# Patient Record
Sex: Male | Born: 1960 | Race: Black or African American | Hispanic: No | Marital: Married | Smoking: Former smoker
Health system: Southern US, Community
[De-identification: ages and names within clinical notes are randomized; demographics above are authoritative.]

## PROBLEM LIST (undated history)

## (undated) DIAGNOSIS — T7840XA Allergy, unspecified, initial encounter: Secondary | ICD-10-CM

## (undated) HISTORY — DX: Allergy, unspecified, initial encounter: T78.40XA

---

## 2008-06-04 ENCOUNTER — Ambulatory Visit: Payer: Self-pay | Admitting: Family Medicine

## 2008-06-04 DIAGNOSIS — F5232 Male orgasmic disorder: Secondary | ICD-10-CM | POA: Insufficient documentation

## 2008-06-04 DIAGNOSIS — R109 Unspecified abdominal pain: Secondary | ICD-10-CM | POA: Insufficient documentation

## 2008-06-04 DIAGNOSIS — K921 Melena: Secondary | ICD-10-CM | POA: Insufficient documentation

## 2008-06-04 DIAGNOSIS — H6120 Impacted cerumen, unspecified ear: Secondary | ICD-10-CM | POA: Insufficient documentation

## 2008-09-02 ENCOUNTER — Ambulatory Visit: Payer: Self-pay | Admitting: Family Medicine

## 2008-09-02 LAB — CONVERTED CEMR LAB: Rapid Strep: NEGATIVE

## 2008-09-03 ENCOUNTER — Encounter: Payer: Self-pay | Admitting: Family Medicine

## 2008-09-03 ENCOUNTER — Telehealth: Payer: Self-pay | Admitting: Family Medicine

## 2009-06-08 ENCOUNTER — Telehealth: Payer: Self-pay | Admitting: Family Medicine

## 2009-07-09 ENCOUNTER — Ambulatory Visit: Payer: Self-pay | Admitting: Family Medicine

## 2009-07-09 DIAGNOSIS — R5383 Other fatigue: Secondary | ICD-10-CM

## 2009-07-09 DIAGNOSIS — R5381 Other malaise: Secondary | ICD-10-CM | POA: Insufficient documentation

## 2009-07-10 ENCOUNTER — Telehealth: Payer: Self-pay | Admitting: Family Medicine

## 2009-07-11 ENCOUNTER — Emergency Department: Payer: Self-pay | Admitting: Emergency Medicine

## 2009-07-13 ENCOUNTER — Telehealth: Payer: Self-pay | Admitting: Family Medicine

## 2009-07-13 LAB — CONVERTED CEMR LAB
ALT: 34 units/L (ref 0–53)
AST: 37 units/L (ref 0–37)
Albumin: 4.1 g/dL (ref 3.5–5.2)
Alkaline Phosphatase: 37 units/L — ABNORMAL LOW (ref 39–117)
BUN: 24 mg/dL — ABNORMAL HIGH (ref 6–23)
Basophils Absolute: 0 10*3/uL (ref 0.0–0.1)
Basophils Relative: 0.7 % (ref 0.0–3.0)
Bilirubin, Direct: 0.1 mg/dL (ref 0.0–0.3)
CO2: 30 meq/L (ref 19–32)
Calcium: 9.7 mg/dL (ref 8.4–10.5)
Chloride: 107 meq/L (ref 96–112)
Creatinine, Ser: 1 mg/dL (ref 0.4–1.5)
Direct LDL: 114.1 mg/dL
Eosinophils Absolute: 0.1 10*3/uL (ref 0.0–0.7)
Eosinophils Relative: 2.6 % (ref 0.0–5.0)
GFR calc non Af Amer: 103.15 mL/min (ref >60)
Glucose, Bld: 73 mg/dL (ref 70–99)
HCT: 39.6 % (ref 39.0–52.0)
Hemoglobin: 12.9 g/dL — ABNORMAL LOW (ref 13.0–17.0)
Lymphocytes Relative: 31.9 % (ref 12.0–46.0)
Lymphs Abs: 1.4 10*3/uL (ref 0.7–4.0)
MCHC: 32.6 g/dL (ref 30.0–36.0)
MCV: 101.3 fL — ABNORMAL HIGH (ref 78.0–100.0)
Monocytes Absolute: 0.3 10*3/uL (ref 0.1–1.0)
Monocytes Relative: 5.9 % (ref 3.0–12.0)
Neutro Abs: 2.7 10*3/uL (ref 1.4–7.7)
Neutrophils Relative %: 58.9 % (ref 43.0–77.0)
PSA: 0.31 ng/mL (ref 0.10–4.00)
Platelets: 224 10*3/uL (ref 150.0–400.0)
Potassium: 4.3 meq/L (ref 3.5–5.1)
RBC: 3.91 M/uL — ABNORMAL LOW (ref 4.22–5.81)
RDW: 11.9 % (ref 11.5–14.6)
Sodium: 139 meq/L (ref 135–145)
TSH: 0.99 microintl units/mL (ref 0.35–5.50)
Total Bilirubin: 1 mg/dL (ref 0.3–1.2)
Total Protein: 7.7 g/dL (ref 6.0–8.3)
WBC: 4.5 10*3/uL (ref 4.5–10.5)

## 2009-07-17 ENCOUNTER — Encounter (INDEPENDENT_AMBULATORY_CARE_PROVIDER_SITE_OTHER): Payer: Self-pay | Admitting: *Deleted

## 2009-07-17 ENCOUNTER — Telehealth: Payer: Self-pay | Admitting: Family Medicine

## 2009-07-24 ENCOUNTER — Ambulatory Visit: Payer: Self-pay | Admitting: Family Medicine

## 2009-07-24 DIAGNOSIS — J069 Acute upper respiratory infection, unspecified: Secondary | ICD-10-CM | POA: Insufficient documentation

## 2010-01-13 ENCOUNTER — Ambulatory Visit: Payer: Self-pay | Admitting: Family Medicine

## 2010-06-15 NOTE — Assessment & Plan Note (Signed)
Summary: PULLED GROIN / LFW   Vital Signs:  Patient profile:   50 year old male Height:      73 inches Weight:      208 pounds BMI:     27.54 Temp:     98 degrees F oral Pulse rate:   60 / minute Pulse rhythm:   regular BP sitting:   124 / 82  (left arm) Cuff size:   regular  Vitals Entered By: Lewanda Rife LPN (January 13, 2010 3:39 PM) CC: pulled right groin   History of Present Illness: pulled a groin muscle -- started to hurt the day after softball practice  (no falls or specific events)  this is the 3rd week  is letting up but still really aggrivating  pinch with ext rot of the hip   no swelling in the area  no pain in the testicle at all and no bulge no back pain   no hernia in past   is taking some advil- cannot tell if it is helping or not    has been soaking in warm tub with epsom salts - this helps a bit      Allergies (verified): No Known Drug Allergies  Past History:  Past Medical History: Last updated: 06/04/2008 Alcoholic, recovering, stopped 4098 h/o occ other drugs Allergic Rhinitis  NO NARCOTICS OR CONTROLLED SUBSTANCES  Past Surgical History: Last updated: 06/04/2008 none  Family History: Last updated: 06/04/2008 Family History Hypertension  Social History: Last updated: 06/04/2008 Married + Retail buyer Recovering Alcoholic h/o Tob, quit. Regular exercise-no  Risk Factors: Alcohol Use: 0 (07/09/2009) Diet: doing great, recent changes, low fat, low carb, much veggies, lean meats (07/09/2009) Exercise: yes (07/09/2009)  Risk Factors: Smoking Status: quit > 6 months (07/09/2009)  Review of Systems General:  Denies chills, fatigue, fever, loss of appetite, and malaise. CV:  Denies chest pain or discomfort and palpitations. Resp:  Denies cough and shortness of breath. GI:  Denies abdominal pain, change in bowel habits, and nausea. GU:  Denies discharge, dysuria, hematuria, and urinary frequency. MS:   Complains of stiffness; denies joint redness and joint swelling. Derm:  Denies lesion(s), poor wound healing, and rash. Neuro:  Denies numbness, tingling, and weakness. Endo:  Denies excessive urination. Heme:  Denies abnormal bruising and bleeding.  Physical Exam  General:  Well-developed,well-nourished,in no acute distress; alert,appropriate and cooperative throughout examination Head:  normocephalic, atraumatic, and no abnormalities observed.   Mouth:  pharynx pink and moist.   Neck:  nl rom / no bony tenderness Chest Wall:  No deformities, masses, tenderness or gynecomastia noted. Lungs:  Normal respiratory effort, chest expands symmetrically. Lungs are clear to auscultation, no crackles or wheezes. Heart:  Normal rate and regular rhythm. S1 and S2 normal without gallop, murmur, click, rub or other extra sounds. Abdomen:  no suprapubic tenderness or fullness felt  normal bowel sounds.   Msk:  no LS tenderness  slt tenderness of R hamstring (very mild)  pain upon ext rot of hip active and passive  nl int rotation  nl slr   Extremities:  No clubbing, cyanosis, edema, or deformity noted with normal full range of motion of all joints.   Neurologic:  sensation intact to light touch, gait normal, and DTRs symmetrical and normal.   Skin:  Intact without suspicious lesions or rashes Inguinal Nodes:  No significant adenopathy Psych:  normal affect, talkative and pleasant    Impression & Recommendations:  Problem # 1:  INGUINAL PAIN,  RIGHT (ICD-789.09) Assessment New with fairly nl exam except pain on external rotation  no hernia/ mass or skin change noted suspect groin muscle strain / spasm that is gradually improving we disc continuing low impact exercise like walking/ gentle stretches/ and applic of heat for 10 min at a time if worse or no imp in 1-2 weeks f/u with Dr Patsy Lager  Complete Medication List: 1)  Viagra 50 Mg Tabs (Sildenafil citrate) .... Take 30-60 minutes prior to  sex as needed 2)  Fexofenadine-pseudoephedrine 60-120 Mg Xr12h-tab (Fexofenadine-pseudoephedrine) .Marland Kitchen.. 1 by mouth two times a day as needed  Patient Instructions: 1)  I think you do have a groin muscle pull 2)  I recommend warm compresses or baths and stretches  3)  advil ok if it helps  4)  if you get worse or do not further improve in the next 2 weeks - update [place]   Current Allergies (reviewed today): No known allergies

## 2010-06-15 NOTE — Assessment & Plan Note (Signed)
Summary: CPX/CLE   Vital Signs:  Patient profile:   50 year old male Height:      73 inches Weight:      208.0 pounds BMI:     27.54 Temp:     98.1 degrees F oral Pulse rate:   80 / minute Pulse rhythm:   regular BP sitting:   118 / 70  (left arm) Cuff size:   regular  Vitals Entered By: Benny Lennert CMA Duncan Dull) (July 09, 2009 2:24 PM)  History of Present Illness: Chief complaint cpx  labs psa dre  colon? No FH colon CA  Has been working out for a month. Going to place and a trainer a couple of times a week. Working out with a Psychologist, educational. Not doing much cardio. Doing a good time with diet.   Preventive Screening-Counseling & Management  Alcohol-Tobacco     Alcohol drinks/day: 0     Smoking Status: quit > 6 months     Tobacco Counseling: to remain off tobacco products  Caffeine-Diet-Exercise     Diet Comments: doing great, recent changes, low fat, low carb, much veggies, lean meats     Diet Counseling: not indicated; diet is assessed to be healthy     Does Patient Exercise: yes     Type of exercise: weights, softball     Times/week: 3     Exercise Counseling: to improve exercise regimen  Comments: Reviewed, and patient maintains doing well in his sobriety and feels better than ever  Hep-HIV-STD-Contraception     STD Risk: no risk noted     Testicular SE Education/Counseling to perform regular STE      Sexual History:  currently monogamous.        Drug Use:  former.    Clinical Review Panels:  Immunizations   Last Tetanus Booster:  Tdap (07/09/2009)   Allergies (verified): No Known Drug Allergies  Past History:  Past medical, surgical, family and social histories (including risk factors) reviewed, and no changes noted (except as noted below).  Past Medical History: Reviewed history from 06/04/2008 and no changes required. Alcoholic, recovering, stopped 1610 h/o occ other drugs Allergic Rhinitis  NO NARCOTICS OR CONTROLLED SUBSTANCES  Past  Surgical History: Reviewed history from 06/04/2008 and no changes required. none  Family History: Reviewed history from 06/04/2008 and no changes required. Family History Hypertension  Social History: Reviewed history from 06/04/2008 and no changes required. 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 Regular exercise-no Smoking Status:  quit > 6 months Does Patient Exercise:  yes STD Risk:  no risk noted Sexual History:  currently monogamous Drug Use:  former  Review of Systems  General: Denies fever, chills, sweats, anorexia, fatigue, weakness, malaise Eyes: Denies blurring, vision loss ENT: Denies earache, nasal congestion, nosebleeds, sore throat, and hoarseness.  Cardiovascular: Denies chest pains, palpitations, syncope, dyspnea on exertion,  Respiratory: Denies cough, dyspnea at rest, excessive sputum,wheeezing GI: Denies nausea, vomiting, diarrhea, constipation, change in bowel habits, abdominal pain, melena, hematochezia GU: Denies dysuria, hematuria, discharge, urinary frequency, urinary hesitancy, nocturia, incontinence, genital sores, decreased libido Musculoskeletal: Denies back pain, joint pain Derm: Denies rash, itching Neuro: Denies  paresthesias, frequent falls, frequent headaches, and difficulty walking.  Psych: Denies depression, anxiety Endocrine: Denies cold intolerance, heat intolerance, polydipsia, polyphagia, polyuria, and unusual weight change.  Heme: Denies enlarged lymph nodes Allergy: No hayfever   Otherwise, the pertinent positives and negatives are listed above and in the HPI, otherwise a full review of  systems has been reviewed and is negative unless noted positive.    Impression & Recommendations:  Problem # 1:  HEALTH MAINTENANCE EXAM (ICD-V70.0) The patient's preventative maintenance and recommended screening tests for an annual wellness exam were reviewed in full today. Brought up to date unless  services declined.  Counselled on the importance of diet, exercise, and its role in overall health and mortality. The patient's FH and SH was reviewed, including their home life, tobacco status, and drug and alcohol status.   Complete Medication List: 1)  Viagra 50 Mg Tabs (Sildenafil citrate) .... Take 30-60 minutes prior to sex as needed  Other Orders: Tdap => 24yrs IM (21308) Admin 1st Vaccine (65784) Venipuncture (69629) TLB-Cholesterol, Direct LDL (83721-DIRLDL) TLB-BMP (Basic Metabolic Panel-BMET) (80048-METABOL) TLB-CBC Platelet - w/Differential (85025-CBCD) TLB-Hepatic/Liver Function Pnl (80076-HEPATIC) TLB-TSH (Thyroid Stimulating Hormone) (84443-TSH) TLB-PSA (Prostate Specific Antigen) (84153-PSA)  Current Allergies (reviewed today): No known allergies    Immunizations Administered:  Tetanus Vaccine:    Vaccine Type: Tdap    Site: left deltoid    Mfr: GlaxoSmithKline    Dose: 0.5 ml    Route: IM    Given by: Benny Lennert CMA (AAMA)    Exp. Date: 07/11/2011    Lot #: [id number] Physical Exam General Appearance: well developed, well nourished, no acute distress Eyes: conjunctiva and lids normal, PERRLA, EOMI Ears, Nose, Mouth, Throat: TM clear, nares clear, oral exam WNL Neck: supple, no lymphadenopathy, no thyromegaly, no JVD Respiratory: clear to auscultation and percussion, respiratory effort normal Cardiovascular: regular rate and rhythm, S1-S2, no murmur, rub or gallop, no bruits, peripheral pulses normal and symmetric, no cyanosis, clubbing, edema or varicosities Chest: no scars, masses, tenderness; no asymmetry, skin changes, nipple discharge, no gynecomastia   Gastrointestinal: soft, non-tender; no hepatosplenomegaly, masses; active bowel sounds all quadrants, no masses, tenderness, hemorrhoids  Genitourinary: no hernia, testicular mass, penile discharge, priapism or prostate enlargement Lymphatic: no cervical, axillary or inguinal  adenopathy Musculoskeletal: gait normal, muscle tone and strength WNL, no joint swelling, effusions, discoloration, crepitus  Skin: clear, good turgor, color WNL, no rashes, lesions, or ulcerations Neurologic: normal mental status, normal reflexes, normal strength, sensation, and motion Psychiatric: alert; oriented to person, place and time Other Exam:

## 2010-06-15 NOTE — Assessment & Plan Note (Signed)
Summary: NEW PT TO EST/CLE   Vital Signs:  Patient Profile:   50 Years Old Male Height:     73 inches (185.42 cm) Weight:      217.13 pounds (98.70 kg) Temp:     98.1 degrees F (36.72 degrees C) oral Pulse rate:   52 / minute Pulse rhythm:   regular BP sitting:   120 / 80  (left arm) Cuff size:   large  Vitals Entered By: Silas Sacramento (June 04, 2008 10:48 AM)                 Chief Complaint:  New pt.  History of Present Illness: 50 year old male patient presents to establish care in office:  Lives in [place].  Works in side in the [employer].   1. Side pain: R side. Got when sitting in the living room. Lifts boxes and stuff. Goes away and it comes back. Has not felt it this week. Hurts some when lying on the R side. Urination normal. no bowel difficulties. No nausea, vomiting, diarrhea. The patient denies any specific trauma. He noticed particularly that he gets this pain when he lies on his right side on the couch. Again, there is no difficulty this week.  2. Trouble L ear: R eear - trouble hearing out of R ear. the patient has had a continued problem with cerumen impaction in the past.  3. Stool is dark: dark brown to black. Certain fast foods upset his stomache. No bright red blood. Taking pepto bismol once a week or so. he occasionally does have some heartburn after eating. Particular with fatty foods and fried fast food. He generally takes Pepto-Bismol after this. He has not tried other problems or solutions.  4. Having trouble with ejaculation. With his wife. Gets hardons. Able to mastubate. Separated for months. Sex 2-3 times a week. he is able is to achieve an erection, and able to achieve penetration. He does sometimes have problems with dryness with his wife. They have not tried any lubrication. He has tried Viagra in the past as well. He is able to achieve orgasm with masturbation. He has had a testosterone level checked previously, which was normal.  Had  some bloodwork last year. Testosterone OK.  Married now for 7 years.    Current Allergies: No known allergies   Past Medical History:    Alcoholic, recovering, stopped 1324    h/o occ other drugs    Allergic Rhinitis        NO NARCOTICS OR CONTROLLED SUBSTANCES  Past Surgical History:    none   Family History:    Family History Hypertension  Social History:    Married + Engineer, manufacturing systems    Recovering Alcoholic    h/o Tob, quit.    Regular exercise-no    Risk Factors:  Exercise:  no   Review of Systems      See HPI       10 point ROS has been performed and is otherwise negative unless noted positive.  General      Denies chills, fatigue, fever, and weight loss.  ENT      Complains of decreased hearing.      Denies ear discharge, earache, nasal congestion, postnasal drainage, sinus pressure, and sore throat.      occ. Allergies, uses Allegra D as needed    CV      Denies chest pain or discomfort.  Resp  Denies cough.  GI      Complains of dark tarry stools and indigestion.      Denies abdominal pain, bloody stools, change in bowel habits, constipation, diarrhea, excessive appetite, loss of appetite, nausea, vomiting, and vomiting blood.  GU      See HPI      Denies decreased libido, discharge, dysuria, hematuria, incontinence, and nocturia.  MS      Complains of joint pain and low back pain.      c/o some occ. shoulder and back pain  Neuro      Denies numbness and tingling.  Psych      Denies anxiety and depression.  Endo      Denies excessive thirst, excessive urination, and polyuria.   Physical Exam  General:     Well-developed,well-nourished,in no acute distress; alert,appropriate and cooperative throughout examination Head:     Normocephalic and atraumatic without obvious abnormalities. No apparent alopecia or balding. Eyes:     vision grossly intact.   Ears:     Cerumen impaction B. o/w normal  externally Nose:     no external deformity.   Mouth:     Oral mucosa and oropharynx without lesions or exudates.  Teeth in good repair. Neck:     No deformities, masses, or tenderness noted. Chest Wall:     No deformities, masses, tenderness or gynecomastia noted. Lungs:     Normal respiratory effort, chest expands symmetrically. Lungs are clear to auscultation, no crackles or wheezes. Heart:     Normal rate and regular rhythm. S1 and S2 normal without gallop, murmur, click, rub or other extra sounds. Abdomen:     Bowel sounds positive,abdomen soft and non-tender without masses, organomegaly or hernias noted. Msk:     Mild tenderness at R posterior side musculature at oblique attachment to ribs. normal ROM, no joint instability, and no crepitation.   Pulses:     radial pulses 2+ Extremities:     No clubbing, cyanosis, edema, or deformity noted with normal full range of motion of all joints.   Neurologic:     alert & oriented X3, sensation intact to light touch, and gait normal.   Cervical Nodes:     No lymphadenopathy noted Psych:     Cognition and judgment appear intact. Alert and cooperative with normal attention span and concentration. No apparent delusions, illusions, hallucinations   Detailed Back/Spine Exam  Lumbosacral Exam:  Inspection-deformity:       ROM WNL Lying Straight Leg Raise:    Right:  negative    Left:  negative Sitting Straight Leg Raise:    Right:  negative    Left:  negative Reverse Straight Leg Raise:    Right:  negative    Left:  negative Contralateral Straight Leg Raise:    Right:  negative    Left:  negative Sciatic Notch:    There is no sciatic notch tenderness.    Impression & Recommendations:  Problem # 1:  MELENA (ICD-578.1) Assessment: New Sent home with stool cards Most likely thing Pepto - stop and switch to Pepcid or Zantac.  Problem # 2:  ANORGASMIA, MALE (ICD-302.74) Assessment: New Long discussion. Able to ejaculate  with masturbation, suspect psych component, good erections. Will get Astroglide, try some new things out with wife. May go to [employer] or other store and keep open dialogue with wife.  If continues to be a problem, would have talk to sex therapy  Viagra as needed   Problem # 3:  FLANK PAIN, RIGHT (ICD-789.09) Assessment: New reassurance given, no signs for 1 week. Do not think related to kidneys - not in right place.  No nsaids for now, restart exercise. Tylenol, moist heat as needed   Problem # 4:  CERUMEN IMPACTION, BILATERAL (ICD-380.4) Assessment: New Nursing flushed ears successfully.  Given maintenance program.  Complete Medication List: 1)  Allegra-d 12 Hour 60-120 Mg Xr12h-tab (Fexofenadine-pseudoephedrine) .... Take one tablet every 12 hours 2)  Viagra 50 Mg Tabs (Sildenafil citrate) .... Take 30-60 minutes prior to sex as needed   Patient Instructions: 1)  Astroglide - type of lube 2)  Work on things with your wife. 3)  Take the stool card. Go to lab to get - ask Terri. 4)  FOR YOUR EARS: 5)  Get over the counter Ear Wax drops 6)  Use for a few days, then get bulb and flush in the bath. 7)  follow-up for a full physical at your convenience - come fasting in the morning if you can so we can get labs.   Prescriptions: VIAGRA 50 MG TABS (SILDENAFIL CITRATE) Take 30-60 minutes prior to sex as needed  #20 x 11   Entered and Authorized by:   Hannah Beat MD   Signed by:   Hannah Beat MD on 06/04/2008   Method used:   Print then Give to Patient   RxID:   [id number]   Prior Medications (reviewed today): ALLEGRA-D 12 HOUR 60-120 MG XR12H-TAB (FEXOFENADINE-PSEUDOEPHEDRINE) take one tablet every 12 hours Current Allergies: No known allergies

## 2010-06-15 NOTE — Letter (Signed)
Summary: Out of Work  [facility]  [address] [place], [place] [address]   Phone: [phone]  Fax: [phone]    July 17, 2009   Employee:  Ollie Sequeira    To Whom It May Concern:   For Medical reasons, please excuse the above named employee from work for the following dates:  Start:   July 14 2009  End:   May return to work on March 7th 2011  If you need additional information, please feel free to contact our office.         Sincerely,    Kerby Nora MD

## 2010-06-15 NOTE — Progress Notes (Signed)
Summary: needs note for work  Phone Note Call from Patient Call back at [employer] [phone]   Summary of Call: Pt has had the flu this week, was not seen here but did talk with Dr. Patsy Lager, who told him to stay out of work.  He needs a note for work to cover 3/01 through 3/05 and will go back to work on monday, 07/20/09.  Please call when ready. Initial call taken by: Lowella Petties CMA,  July 17, 2009 9:06 AM  Follow-up for Phone Call        Please provide note as requested.  Follow-up by: Kerby Nora MD,  July 17, 2009 9:21 AM  Additional Follow-up for Phone Call Additional follow up Details #1::        NOTE READY AND PATIENT NOTIFIED TO PCIK UP AT HIS CONFEINCE Additional Follow-up by: Benny Lennert CMA Duncan Dull),  July 17, 2009 9:29 AM

## 2010-06-15 NOTE — Progress Notes (Signed)
Summary: viagra  Phone Note Refill Request Message from:  Fax from Pharmacy on June 08, 2009 11:34 AM  Refills Requested: Medication #1:  VIAGRA 50 MG TABS Take 30-60 minutes prior to sex as needed   Supply Requested: 1 month   Last Refilled: 05/24/2009 [facility] [address] [phone]   Method Requested: Electronic Initial call taken by: Benny Lennert CMA (AAMA),  June 08, 2009 11:35 AM    Prescriptions: VIAGRA 50 MG TABS (SILDENAFIL CITRATE) Take 30-60 minutes prior to sex as needed  #20 x 11   Entered and Authorized by:   Hannah Beat MD   Signed by:   Hannah Beat MD on 06/08/2009   Method used:   Electronically to        [facility] [address] [phone]* (retail)       [address], [place]  [address]       Ph: [phone]       Fax: [phone]   RxID:   [id number]

## 2010-06-15 NOTE — Progress Notes (Signed)
Summary: wants cough med  Phone Note Call from Patient Call back at Home Phone [phone]   Caller: Patient Call For: Hannah Beat MD Summary of Call: Patient says that after leaving yesterday he developed a bad cough. He says that it is so bad it is making his stomach hurt. He wants to know if he can have some cough med. sent to [facility] in [place] on [address]  Initial call taken by: Melody Comas,  July 10, 2009 3:41 PM  Follow-up for Phone Call        Let him know sent in.   This is pretty good for cough and can numb throat a little. Drink plenty of fluids, get rest. Expect to be sick at least 7-10 days with some coughing for up to 2 weeks after he starts to feel better.  For him, would not use use some of the strong cough meds with codeine, etc. in it. Follow-up by: Hannah Beat MD,  July 12, 2009 8:49 AM  Additional Follow-up for Phone Call Additional follow up Details #1::        patient advised.Consuello Masse CMA  Additional Follow-up by: Benny Lennert CMA (AAMA),  July 13, 2009 9:05 AM    New/Updated Medications: TESSALON 200 MG CAPS (BENZONATATE) Take one capsule by mouth three times a day as needed for cough Prescriptions: TESSALON 200 MG CAPS (BENZONATATE) Take one capsule by mouth three times a day as needed for cough  #60 x 0   Entered and Authorized by:   Hannah Beat MD   Signed by:   Hannah Beat MD on 07/12/2009   Method used:   Electronically to        [facility] [address] [phone]* (retail)       [address], [place]  [address]       Ph: [phone]       Fax: [phone]   RxID:   [id number]

## 2010-06-15 NOTE — Progress Notes (Signed)
Summary: call a nurse   Phone Note Call from Patient Call back at Home Phone [phone]   Caller: Patient Call For: Hannah Beat MD Summary of Call: Triage Record Num: [id number] Operator: Bethanie Dicker Patient Name: Glorious Peach Call Date & Time: 07/11/2009 4:23:11PM Patient Phone: PCP: Karleen Hampshire Azad Calame Patient Gender: Male PCP Fax : [phone] Patient DOB: 1961-03-06 Practice Name: [facility] Reason for Call: Lisa/ wife calling about fever that started yesterday 07/10/09. Last temp 102.5 O at 1625. Also complaining of cough that started yesterday and stiff neck and neck pain that strted this AM. Also c/o generalized body aches as well. Mild headache and pain in neck with mvmt. [facility] immediately for eval. Protocol(s) Used: Neck Pain or Injury Recommended Outcome per Protocol: See ED Immediately Reason for Outcome: Neck pain with movement (no injury) AND headache or nausea and vomiting Care Advice:  ~ Another adult should drive. Write down provider's name. List or place the following in a bag for transport with the patient: current prescription and/or OTC medications; alternative treatments, therapies and medications; and street drugs.  ~  ~ Do not eat or drink anything until evaluated by provider. Call EMS 911 immediately if any of the following occur: any loss of consciousness; new confusion, drowsiness or agitation; difficulty speaking; new weakness or paralysis, severe numbness, or difficulty moving.  ~  ~ IMMEDIATE ACTION 02/ Initial call taken by: Melody Comas,  July 13, 2009 10:56 AM  Follow-up for Phone Call        Please call and check on Nehemiah and make sure he is feeling OK, fever 102 - 103 much different than when i saw him Follow-up by: Hannah Beat MD,  July 13, 2009 1:14 PM  Additional Follow-up for Phone Call Additional follow up Details #1::        Left message on machine for patient to return call.  Linde Gillis  CMA Duncan Dull)  July 13, 2009 1:21 PM   Patient says he had a fever last night of 102.5, still has body aches, and a headache.  Was told to take Motrin when he left the ER.  Please advise. Additional Follow-up by: Linde Gillis CMA Duncan Dull),  July 13, 2009 3:04 PM    Additional Follow-up for Phone Call Additional follow up Details #2::    Pt is requesting that an antibiotic be called to [facility] in [place],  please let pt know.         Lowella Petties CMA  July 13, 2009 3:23 PM   Additional Follow-up for Phone Call Additional follow up Details #3:: Details for Additional Follow-up Action Taken: d/w patient and diagnosed with influenza at the ER on Sat. Feels horrible, taking some cough medicine and tylenol - discussed care in flu, no work until AF x 24 hours. Additional Follow-up by: Hannah Beat MD,  July 13, 2009 5:38 PM

## 2010-06-15 NOTE — Progress Notes (Signed)
Summary: needs out of work note for tomorrow  Phone Note Call from Patient Call back at Home Phone [phone]   Caller: Patient Call For: Hannah Beat MD Summary of Call: Pt is requesting out of work note for tomorrow as well, he has one from 4/20 through 4/21 but is asking that it be rewritten to be out through tomorrow.  He brought his current note back in. Initial call taken by: Lowella Petties,  September 03, 2008 10:35 AM  Follow-up for Phone Call        ok. please do this. Follow-up by: Hannah Beat MD,  September 03, 2008 10:40 AM  Additional Follow-up for Phone Call Additional follow up Details #1::        spoke with patient and advised that work note is ready for pick-up. Additional Follow-up by: Mervin Hack CMA,  September 03, 2008 2:43 PM

## 2010-06-15 NOTE — Assessment & Plan Note (Signed)
Summary: feeling sick   Vital Signs:  Patient profile:   50 year old male Height:      73 inches Weight:      207 pounds BMI:     27.41 Temp:     98.1 degrees F oral Pulse rate:   76 / minute Pulse rhythm:   regular BP sitting:   112 / 80  (left arm) Cuff size:   regular  Vitals Entered By: Linde Gillis CMA Duncan Dull) (July 24, 2009 2:01 PM) CC: feeling sick   History of Present Illness: 50 year old:    Acute Visit History:      The patient complains of cough, nasal discharge, sinus problems, and sore throat.  These symptoms began 3 days ago.  He denies fever and headache.        The cough interferes with his sleep.  The character of the cough is described as nonproductive.  There is no history of wheezing, shortness of breath, respiratory retractions, tachypnea, cyanosis, or interference with oral intake associated with his cough.        'Cold' or URI symptoms have been present with the sore throat.  There is no history of dysphagia, drooling, or recent exposure to strep.        Urine output has been normal.  He is tolerating clear liquids.        Allergies (verified): No Known Drug Allergies  Past History:  Past medical, surgical, family and social histories (including risk factors) reviewed, and no changes noted (except as noted below).  Past Medical History: Reviewed history from 06/04/2008 and no changes required. Alcoholic, recovering, stopped 1914 h/o occ other drugs Allergic Rhinitis  NO NARCOTICS OR CONTROLLED SUBSTANCES  Past Surgical History: Reviewed history from 06/04/2008 and no changes required. none  Family History: Reviewed history from 06/04/2008 and no changes required. Family History Hypertension  Social History: Reviewed history from 06/04/2008 and no changes required. 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 Regular exercise-no  Review of Systems       REVIEW OF SYSTEMS GEN: Acute illness details  above. CV: No chest pain or SOB GI: No noted N or V Otherwise, pertinent positives and negatives are noted in the HPI.   Physical Exam  Additional Exam:  GEN: WDWN, NAD; alert,appropriate and cooperative throughout exam HEENT: Normocephalic and atraumatic. Throat clear, w/o exudate, no LAD, R TM clear, L TM - good landmarks, No fluid present. rhinnorhea.  Left frontal and maxillary sinuses: NT Right frontal and maxillary sinuses: NT NECK: No ant or post LAD CV: RRR, No M/G/R PULM: no resp distress, no accessory muscles.  No retractions. no w/c/r ABD: S,NT,ND,+BS, No HSM EXTR: no c/c/e PSYCH: full affect, pleasant, conversant    Impression & Recommendations:  Problem # 1:  URI (ICD-465.9) supportive care if signs > 7-10 days, ok to start amox  The following medications were removed from the medication list:    Tessalon 200 Mg Caps (Benzonatate) .Marland Kitchen... Take one capsule by mouth three times a day as needed for cough His updated medication list for this problem includes:    Fexofenadine-pseudoephedrine 60-120 Mg Xr12h-tab (Fexofenadine-pseudoephedrine) .Marland Kitchen... 1 by mouth two times a day  Complete Medication List: 1)  Viagra 50 Mg Tabs (Sildenafil citrate) .... Take 30-60 minutes prior to sex as needed 2)  Fexofenadine-pseudoephedrine 60-120 Mg Xr12h-tab (Fexofenadine-pseudoephedrine) .Marland Kitchen.. 1 by mouth two times a day 3)  Amoxicillin 875 Mg Tabs (Amoxicillin) .Marland Kitchen.. 1 by mouth two times  a day  Patient Instructions: 1)  Upper Respiratory Infection 2)  -Viral Infections 3)  TREATMENT 4)  1. Drink plenty of fluids, but limit caffeine 5)  2. Decongestant: for congested noses, sinuses, and ear tubes. Pressure release and help drainage: LIKE ALLEGRA-D 6)  3. Nasal Sprays: Relieve pressure, promote drainage, open nasal and ear passages. Afrin can be used for only 3 days in row. 7)  4. Nasal Saline: Moisten and smooth membranes, no side effects  8)  6. Expectorants: Liquify secretions and  improve drainage. (ex=Robitussin or Mucinex) DURING THE DAY Prescriptions: AMOXICILLIN 875 MG TABS (AMOXICILLIN) 1 by mouth two times a day  #20 x 0   Entered and Authorized by:   Hannah Beat MD   Signed by:   Hannah Beat MD on 07/24/2009   Method used:   Print then Give to Patient   RxID:   [id number] FEXOFENADINE-PSEUDOEPHEDRINE 60-120 MG XR12H-TAB (FEXOFENADINE-PSEUDOEPHEDRINE) 1 by mouth two times a day  #60 x 3   Entered and Authorized by:   Hannah Beat MD   Signed by:   Hannah Beat MD on 07/24/2009   Method used:   Electronically to        [facility] [address] [phone]* (retail)       [address], [place]  [address]       Ph: [phone]       Fax: [phone]   RxID:   [id number]   Current Allergies (reviewed today): No known allergies

## 2010-06-15 NOTE — Letter (Signed)
Summary: Out of Work  [facility]  [address] [place], [place] [address]   Phone: [phone]  Fax: [phone]    September 02, 2008   Employee:  Marwan Cruey    To Whom It May Concern:   For Medical reasons, please excuse the above named employee from work for the following dates:  Start:   09/02/2008  End:   09/03/2008 - ok to return to work 09/04/2008  If you need additional information, please feel free to contact our office.         Sincerely,    Hannah Beat MD

## 2010-06-15 NOTE — Letter (Signed)
Summary: Out of Work  [facility]  [address] [place], [place] [address]   Phone: [phone]  Fax: [phone]    September 03, 2008   Employee:  [name]    To Whom It May Concern:   For Medical reasons, please excuse the above named employee from work for the following dates:  Start: 09/02/2008  End:  09/05/2008  If you need additional information, please feel free to contact our office.         Sincerely,      DeShannon Katrinka Blazing CMA

## 2010-06-15 NOTE — Assessment & Plan Note (Signed)
Summary: SICK/DLO   Vital Signs:  Patient profile:   50 year old male Height:      73 inches Weight:      209.0 pounds BMI:     27.67 Temp:     98.2 degrees F oral Pulse rate:   80 / minute Pulse rhythm:   regular BP sitting:   120 / 70  (left arm) Cuff size:   regular  Vitals Entered By: Benny Lennert CMA (September 02, 2008 12:32 PM)  History of Present Illness: Chief complaint sick  Feeling sick, trouble swallowing.  Sore Having headaches Having some fever Woke up in the middle of the night  Fever has been ongoing since Monday.  Acute Visit History:      The patient complains of abdominal pain, headache, and sore throat.  These symptoms began 3 days ago.  He denies chest pain, constipation, cough, diarrhea, earache, eye symptoms, fever, genitourinary symptoms, musculoskeletal symptoms, nasal discharge, nausea, rash, sinus problems, and vomiting.        He has had dysphagia associated with the sore throat.  There is no history of drooling, cold/URI symptoms, or recent exposure to strep.        Urine output has been normal.  He is tolerating clear liquids.        Allergies (verified): No Known Drug Allergies  Past History:  Past medical, surgical, family and social histories (including risk factors) reviewed, and no changes noted (except as noted below).  Past Medical History:    Reviewed history from 06/04/2008 and no changes required:    Alcoholic, recovering, stopped 1610    h/o occ other drugs    Allergic Rhinitis        NO NARCOTICS OR CONTROLLED SUBSTANCES  Past Surgical History:    Reviewed history from 06/04/2008 and no changes required:    none  Family History:    Reviewed history from 06/04/2008 and no changes required:       Family History Hypertension  Social History:    Reviewed history from 06/04/2008 and no changes required:       Married + Programmer, applications       Recovering Alcoholic       h/o Tob, quit.       Regular  exercise-no  Review of Systems       REVIEW OF SYSTEMS GEN: The details of the acute illness are noted above. The ROS includes those things noted in various systems. CV: No chest pain or SOB GI: No noted N or V Otherwise, pertinent positives and negatives are noted in the HPI. Additional ROS may be included in the Centricity ROS section, but if not, then this constitutes the ROS.   Physical Exam  Additional Exam:  Gen: WDWN, NAD; A & O x3, cooperative. Pleasant.Globally Non-toxic HEENT: Normocephalic and atraumatic. Throat clear, w/o exudate, R TM clear, L TM - good landmarks, No fluid present. rhinnorhea. No frontal or maxillary sinus T. MMM NECK: Anterior cervical  LAD is present, tender to palpation CV: RRR, No M/G/R, cap refill <2 sec PULM: Breathing comfortably in no respiratory distress. no wheezing, crackles, rhonchi ABD: S,NT,ND,+BS. No HSM. No rebound. EXT: No c/c/e PSYCH: Friendly, good eye contact MSK: Nml gait    Impression & Recommendations:  Problem # 1:  PHARYNGITIS-ACUTE (ICD-462) Assessment New  Even with neg strep rapid, clinical concern enough to warrant treatment.  Pen VK  symptomatic care.  His updated medication  list for this problem includes:    Penicillin V Potassium 500 Mg Tabs (Penicillin v potassium) .Marland Kitchen... 1 by mouth two times a day  Orders: Rapid Strep (72536)  Complete Medication List: 1)  Viagra 50 Mg Tabs (Sildenafil citrate) .... Take 30-60 minutes prior to sex as needed 2)  Penicillin V Potassium 500 Mg Tabs (Penicillin v potassium) .Marland Kitchen.. 1 by mouth two times a day Prescriptions: PENICILLIN V POTASSIUM 500 MG TABS (PENICILLIN V POTASSIUM) 1 by mouth two times a day  #20 x 0   Entered and Authorized by:   Hannah Beat MD   Signed by:   Hannah Beat MD on 09/02/2008   Method used:   Print then Give to Patient   RxID:   [id number]       Current Allergies (reviewed today): No known allergies  Laboratory Results     Date/Time Reported: September 02, 2008 1:08 PM   Other Tests  Rapid Strep: negative

## 2010-06-15 NOTE — Letter (Signed)
Summary: Out of Work  [facility]  [address] [place], [place] [address]   Phone: [phone]  Fax: [phone]    September 03, 2008   Employee:  Shean Athey    To Whom It May Concern:   For Medical reasons, please excuse the above named employee from work for the following dates:  Start:   09/02/2008                    End:   09/05/2008  If you need additional information, please feel free to contact our office.         Sincerely,      Hannah Beat, MD

## 2010-06-23 ENCOUNTER — Telehealth (INDEPENDENT_AMBULATORY_CARE_PROVIDER_SITE_OTHER): Payer: Self-pay | Admitting: *Deleted

## 2010-07-01 NOTE — Progress Notes (Signed)
Summary: viagra   Phone Note Refill Request Message from:  Patient on June 23, 2010 3:21 PM  Refills Requested: Medication #1:  VIAGRA 50 MG TABS Take 30-60 minutes prior to sex as needed Uses [facility] [address]   Initial call taken by: Melody Comas,  June 23, 2010 3:21 PM  Follow-up for Phone Call        heather, i sent to wrong pharmacY. Follow-up by: Hannah Beat MD,  June 23, 2010 4:22 PM  Additional Follow-up for Phone Call Additional follow up Details #1::        [facility] and sent to [facility] Additional Follow-up by: Benny Lennert CMA Duncan Dull),  June 23, 2010 4:34 PM    Prescriptions: VIAGRA 50 MG TABS (SILDENAFIL CITRATE) Take 30-60 minutes prior to sex as needed  #10 x 11   Entered by:   Benny Lennert CMA (AAMA)   Authorized by:   Hannah Beat MD   Signed by:   Benny Lennert CMA (AAMA) on 06/23/2010   Method used:   Faxed to ...       [facility] (retail)       [address]       [place], [place]    [place]       Ph: [phone]       Fax: [phone]   RxID:   [id number] VIAGRA 50 MG TABS (SILDENAFIL CITRATE) Take 30-60 minutes prior to sex as needed  #10 x 11   Entered and Authorized by:   Hannah Beat MD   Signed by:   Hannah Beat MD on 06/23/2010   Method used:   Electronically to        [facility] [address]* (retail)       [address]       [place], [place]  [address]       Ph: [phone]       Fax: [phone]   RxID:   [id number]

## 2010-09-28 ENCOUNTER — Encounter: Payer: Self-pay | Admitting: Family Medicine

## 2010-09-29 ENCOUNTER — Encounter: Payer: Self-pay | Admitting: Family Medicine

## 2010-09-29 ENCOUNTER — Ambulatory Visit (INDEPENDENT_AMBULATORY_CARE_PROVIDER_SITE_OTHER): Payer: BC Managed Care – PPO | Admitting: Family Medicine

## 2010-09-29 VITALS — BP 120/70 | HR 64 | Temp 99.1°F | Ht 74.0 in | Wt 207.8 lb

## 2010-09-29 DIAGNOSIS — M25569 Pain in unspecified knee: Secondary | ICD-10-CM

## 2010-09-29 DIAGNOSIS — H6120 Impacted cerumen, unspecified ear: Secondary | ICD-10-CM

## 2010-09-29 NOTE — Patient Instructions (Signed)
Advil take 4 tablets 3 times a day over the next 2-3 weeks  If you are still having a lot of pain and swelling in 3 weeks, come back in

## 2010-09-29 NOTE — Progress Notes (Signed)
50 year old male:  Has been doing cross fit  Power snatches - medial knee hurt, twisted outward.   Patient presents with ~10 day h/o R medial sided knee pain after crossfit, foot turned out when trying to catch weight. No audible pop was heard. The patient has had an effusion. No symptomatic giving-way. No mechanical clicking. Joint has not locked up. Patient has been able to walk but was limping, now improved. The patient does not have pain going up and down stairs or rising from a seated position.   Pain location: medial Current physical activity: able to walk, has been really active Prior Knee Surgery: none on R Current pain meds: Bracing: none Occupation or school level:  The PMH, PSH, Social History, Family History, Medications, and allergies have been reviewed in [facility], and have been updated if relevant.  GEN: Well-developed,well-nourished,in no acute distress; alert,appropriate and cooperative throughout examination HEENT: Normocephalic and atraumatic without obvious abnormalities. Ears, externally no deformities PULM: Breathing comfortably in no respiratory distress EXT: No clubbing, cyanosis, or edema PSYCH: Normally interactive. Cooperative during the interview. Pleasant. Friendly and conversant. Not anxious or depressed appearing. Normal, full affect.  Gait: Normal heel toe pattern ROM: 0 - 115 Effusion: moderate Echymosis or edema: none Patellar tendon NT Painful PLICA: neg Patellar grind: negative Medial and lateral patellar facet loading: negative medial and lateral joint lines:NT Mcmurray's neg Flexion-pinch painful Varus and valgus stress: stable Lachman: neg Ant and Post drawer: neg Hip abduction, IR, ER: WNL Hip flexion str: 5/5 Hip abd: 5/5 Quad: 5/5 VMO atrophy:No Hamstring concentric and eccentric: 5/5  A/P: Knee Pain oa flare vs. Meniscal contusion vs. Small tear Ligaments stable  He is improving, for now i think ok to watch conservatively, NSAIDS,  compression, activity mod, if not better in a few weeks, recheck  R. Cerumen impaction, washed out in the office with success

## 2011-02-21 ENCOUNTER — Other Ambulatory Visit: Payer: BC Managed Care – PPO

## 2011-03-02 ENCOUNTER — Ambulatory Visit (INDEPENDENT_AMBULATORY_CARE_PROVIDER_SITE_OTHER): Payer: BC Managed Care – PPO | Admitting: Family Medicine

## 2011-03-02 ENCOUNTER — Encounter: Payer: Self-pay | Admitting: Family Medicine

## 2011-03-02 VITALS — BP 108/70 | HR 60 | Temp 98.0°F | Wt 203.8 lb

## 2011-03-02 DIAGNOSIS — Z23 Encounter for immunization: Secondary | ICD-10-CM

## 2011-03-02 DIAGNOSIS — Z0000 Encounter for general adult medical examination without abnormal findings: Secondary | ICD-10-CM

## 2011-03-02 LAB — CBC WITH DIFFERENTIAL/PLATELET
Basophils Absolute: 0 10*3/uL (ref 0.0–0.1)
Basophils Relative: 0.6 % (ref 0.0–3.0)
Eosinophils Absolute: 0.1 10*3/uL (ref 0.0–0.7)
Eosinophils Relative: 2.1 % (ref 0.0–5.0)
HCT: 41.6 % (ref 39.0–52.0)
Hemoglobin: 13.6 g/dL (ref 13.0–17.0)
Lymphocytes Relative: 32.6 % (ref 12.0–46.0)
Lymphs Abs: 1.3 10*3/uL (ref 0.7–4.0)
MCHC: 32.7 g/dL (ref 30.0–36.0)
MCV: 98.3 fl (ref 78.0–100.0)
Monocytes Absolute: 0.4 10*3/uL (ref 0.1–1.0)
Monocytes Relative: 10.2 % (ref 3.0–12.0)
Neutro Abs: 2.2 10*3/uL (ref 1.4–7.7)
Neutrophils Relative %: 54.5 % (ref 43.0–77.0)
Platelets: 242 10*3/uL (ref 150.0–400.0)
RBC: 4.23 Mil/uL (ref 4.22–5.81)
RDW: 13.2 % (ref 11.5–14.6)
WBC: 4.1 10*3/uL — ABNORMAL LOW (ref 4.5–10.5)

## 2011-03-02 LAB — HEPATIC FUNCTION PANEL
ALT: 19 U/L (ref 0–53)
AST: 23 U/L (ref 0–37)
Albumin: 4.2 g/dL (ref 3.5–5.2)
Alkaline Phosphatase: 43 U/L (ref 39–117)
Bilirubin, Direct: 0.1 mg/dL (ref 0.0–0.3)
Total Bilirubin: 1.2 mg/dL (ref 0.3–1.2)
Total Protein: 7.3 g/dL (ref 6.0–8.3)

## 2011-03-02 LAB — BASIC METABOLIC PANEL
BUN: 19 mg/dL (ref 6–23)
CO2: 29 mEq/L (ref 19–32)
Calcium: 9.5 mg/dL (ref 8.4–10.5)
Chloride: 104 mEq/L (ref 96–112)
Creatinine, Ser: 0.8 mg/dL (ref 0.4–1.5)
GFR: 136.44 mL/min (ref >60.00)
Glucose, Bld: 92 mg/dL (ref 70–99)
Potassium: 4.8 mEq/L (ref 3.5–5.1)
Sodium: 138 mEq/L (ref 135–145)

## 2011-03-02 LAB — LDL CHOLESTEROL, DIRECT: Direct LDL: 130.2 mg/dL

## 2011-03-02 LAB — LIPID PANEL
Cholesterol: 201 mg/dL — ABNORMAL HIGH (ref 0–200)
HDL: 57.7 mg/dL (ref >39.00)
Total CHOL/HDL Ratio: 3
Triglycerides: 46 mg/dL (ref 0.0–149.0)
VLDL: 9.2 mg/dL (ref 0.0–40.0)

## 2011-03-02 LAB — PSA: PSA: 0.44 ng/mL (ref 0.10–4.00)

## 2011-03-02 NOTE — Progress Notes (Signed)
  Subjective:    Patient ID: Darius Solis, male    DOB: [birth year], 50 y.o.   MRN: [id number]  HPI  Darius Solis, a 50 y.o. male presents today in the office for the following:    Preventative Health Maintenance Visit:  Health Maintenance Summary Reviewed and updated, unless pt declines services.  Tobacco History Reviewed. Alcohol: No concerns, no excessive use Exercise Habits: Some activity, rec at least 30 mins 5 times a week STD concerns: no risk or activity to increase risk Drug Use: None Encouraged self-testicular check  Health Maintenance  Topic Date Due  . Influenza Vaccine  02/14/2011  . Tetanus/tdap  07/10/2019    Going to the DR Playing softball - stopped doing crossfit.   His cuff was inflammed a little bit.  RTC exercises  The PMH, PSH, Social History, Family History, Medications, and allergies have been reviewed in [facility], and have been updated if relevant.   Review of Systems General: Denies fever, chills, sweats. No significant weight loss. Eyes: Denies blurring,significant itching ENT: Denies earache, sore throat, and hoarseness. Cardiovascular: Denies chest pains, palpitations, dyspnea on exertion Respiratory: Denies cough, dyspnea at rest,wheeezing Breast: no concerns about lumps GI: Denies nausea, vomiting, diarrhea, constipation, change in bowel habits, abdominal pain, melena, hematochezia GU: Denies penile discharge, ED, urinary flow / outflow problems. No STD concerns. Musculoskeletal: Denies back pain, joint pain, above Derm: Denies rash, itching Neuro: Denies  paresthesias, frequent falls, frequent headaches Psych: Denies depression, anxiety Endocrine: Denies cold intolerance, heat intolerance, polydipsia Heme: Denies enlarged lymph nodes Allergy: No hayfever     Objective:   Physical Exam   Physical Exam  Blood pressure 108/70, pulse 60, temperature 98 F (36.7 C), temperature source Oral, weight 203 lb 12.8 oz (92.443 kg), SpO2  98.00%.  PE: GEN: well developed, well nourished, no acute distress Eyes: conjunctiva and lids normal, PERRLA, EOMI ENT: TM clear, nares clear, oral exam WNL Neck: supple, no lymphadenopathy, no thyromegaly, no JVD Pulm: clear to auscultation and percussion, respiratory effort normal CV: regular rate and rhythm, S1-S2, no murmur, rub or gallop, no bruits, peripheral pulses normal and symmetric, no cyanosis, clubbing, edema or varicosities Chest: no scars, masses, no gynecomastia   GI: soft, non-tender; no hepatosplenomegaly, masses; active bowel sounds all quadrants GU: no hernia, testicular mass, penile discharge, priapism or prostate enlargement Lymph: no cervical, axillary or inguinal adenopathy MSK: gait normal, muscle tone and strength WNL, no joint swelling, effusions, discoloration, crepitus . Minimal impingement SKIN: clear, good turgor, color WNL, no rashes, lesions, or ulcerations Neuro: normal mental status, normal strength, sensation, and motion Psych: alert; oriented to person, place and time, normally interactive and not anxious or depressed in appearance.       Assessment & Plan:   1. Routine general medical examination at a health care facility  Basic metabolic panel, CBC with Differential, Hepatic function panel, Lipid panel, PSA  2. Flu vaccine need  Flu vaccine greater than or equal to 3yo preservative free IM    The patient's preventative maintenance and recommended screening tests for an annual wellness exam were reviewed in full today. Brought up to date unless services declined.  Counselled on the importance of diet, exercise, and its role in overall health and mortality. The patient's FH and SH was reviewed, including their home life, tobacco status, and drug and alcohol status.

## 2011-07-15 ENCOUNTER — Other Ambulatory Visit: Payer: Self-pay | Admitting: *Deleted

## 2011-07-15 NOTE — Telephone Encounter (Signed)
Received refill request for Viagra 100mg , on medication it listed Viagra 50mg .  Called patient and left a message on cell phone voicemail for him to call back.

## 2011-07-15 NOTE — Telephone Encounter (Signed)
Patient called back to verify that he takes Viagra 100mg .  Please advise.

## 2011-07-16 MED ORDER — SILDENAFIL CITRATE 100 MG PO TABS: ORAL_TABLET | ORAL | Status: DC

## 2011-07-16 NOTE — Telephone Encounter (Signed)
Refilled, let know.

## 2011-10-28 ENCOUNTER — Telehealth: Payer: Self-pay

## 2011-10-28 NOTE — Telephone Encounter (Signed)
pts wife has lyme's disease, joint pain and mobility issues.Pt takes care of wife and will bring FMLA form on 10/31/11.

## 2011-10-30 NOTE — Telephone Encounter (Signed)
Will attempt to fill out to the best of my ability

## 2011-11-10 ENCOUNTER — Telehealth: Payer: Self-pay | Admitting: Family Medicine

## 2011-11-10 NOTE — Telephone Encounter (Signed)
Pt recently had FMLA papers filled out and there was one page that was not completed. I put them in your inbox. He assured me that you filled them out, I wasn't too sure since his primary is Dr .Patsy Lager.

## 2011-11-11 NOTE — Telephone Encounter (Signed)
In my box

## 2011-11-11 NOTE — Telephone Encounter (Signed)
Forms given to [facility], who will notify patient.

## 2012-06-22 ENCOUNTER — Ambulatory Visit: Payer: BC Managed Care – PPO | Admitting: Family Medicine

## 2012-06-22 ENCOUNTER — Encounter: Payer: Self-pay | Admitting: Family Medicine

## 2012-06-22 ENCOUNTER — Ambulatory Visit (INDEPENDENT_AMBULATORY_CARE_PROVIDER_SITE_OTHER): Payer: BC Managed Care – PPO | Admitting: Family Medicine

## 2012-06-22 ENCOUNTER — Encounter: Payer: Self-pay | Admitting: *Deleted

## 2012-06-22 VITALS — BP 120/70 | HR 77 | Temp 98.8°F | Ht 74.0 in | Wt 220.8 lb

## 2012-06-22 DIAGNOSIS — J069 Acute upper respiratory infection, unspecified: Secondary | ICD-10-CM

## 2012-06-22 MED ORDER — HYDROCODONE-HOMATROPINE 5-1.5 MG/5ML PO SYRP: 5.0000 mL | ORAL_SOLUTION | Freq: Every evening | ORAL | Status: DC | PRN

## 2012-06-22 NOTE — Patient Instructions (Addendum)
You have a viral infection. It will take 5-7 more days to have this resolve. You should continue to feel better gradually. Stop your wife's doxycycline. Rest, fluids. Mucinex DM or Dayquil during the day. No antibiotic needed. Cough suppressant at night.

## 2012-06-22 NOTE — Assessment & Plan Note (Signed)
Symptomatic care. Cough suppressant. Reviewed viral timeline and expected course.  Stop doxycycline... Not consistent with strep throat.

## 2012-06-22 NOTE — Progress Notes (Signed)
  Subjective:    Patient ID: Darius Solis, male    DOB: 12-19-1960, 52 y.o.   MRN: [id number]  Cough This is a new problem. The current episode started in the past 7 days (6 days). The problem has been gradually worsening. The problem occurs constantly. The cough is productive of sputum (keeping him up at night). Associated symptoms include nasal congestion and a sore throat. Pertinent negatives include no ear congestion, ear pain, fever, headaches, rash, rhinorrhea or shortness of breath. Associated symptoms comments: Sinus pressure  bodyaches. Risk factors: Nonsmoker former minimal smoker. He has tried OTC cough suppressant (took one doxyxycine from his wife who has lyme disease. OTC cold meds.) for the symptoms. The treatment provided moderate relief. There is no history of asthma, COPD or environmental allergies.     Grandson with strep throat Review of Systems  Constitutional: Negative for fever.  HENT: Positive for sore throat. Negative for ear pain and rhinorrhea.   Respiratory: Positive for cough. Negative for shortness of breath.   Skin: Negative for rash.  Neurological: Negative for headaches.  Hematological: Negative for environmental allergies.       Objective:   Physical Exam  Constitutional: Vital signs are normal. He appears well-developed and well-nourished.  Non-toxic appearance. He does not appear ill. No distress.  HENT:  Head: Normocephalic and atraumatic.  Right Ear: Hearing, tympanic membrane, external ear and ear canal normal. No tenderness. No foreign bodies. Tympanic membrane is not retracted and not bulging.  Left Ear: Hearing, tympanic membrane, external ear and ear canal normal. No tenderness. No foreign bodies. Tympanic membrane is not retracted and not bulging.  Nose: Nose normal. No mucosal edema or rhinorrhea. Right sinus exhibits no maxillary sinus tenderness and no frontal sinus tenderness. Left sinus exhibits no maxillary sinus tenderness and no frontal  sinus tenderness.  Mouth/Throat: Uvula is midline, oropharynx is clear and moist and mucous membranes are normal. Normal dentition. No dental caries. No oropharyngeal exudate or tonsillar abscesses.  Eyes: Conjunctivae normal, EOM and lids are normal. Pupils are equal, round, and reactive to light. No foreign bodies found.  Neck: Trachea normal, normal range of motion and phonation normal. Neck supple. Carotid bruit is not present. No mass and no thyromegaly present.  Cardiovascular: Normal rate, regular rhythm, S1 normal, S2 normal, normal heart sounds, intact distal pulses and normal pulses.  Exam reveals no gallop.   No murmur heard. Pulmonary/Chest: Effort normal and breath sounds normal. No respiratory distress. He has no wheezes. He has no rhonchi. He has no rales.  Abdominal: Soft. Normal appearance and bowel sounds are normal. There is no hepatosplenomegaly. There is no tenderness. There is no rebound, no guarding and no CVA tenderness. No hernia.  Neurological: He is alert. He has normal reflexes.  Skin: Skin is warm, dry and intact. No rash noted.  Psychiatric: He has a normal mood and affect. His speech is normal and behavior is normal. Judgment normal.          Assessment & Plan:

## 2012-09-24 ENCOUNTER — Other Ambulatory Visit: Payer: Self-pay | Admitting: *Deleted

## 2012-09-24 MED ORDER — SILDENAFIL CITRATE 100 MG PO TABS: ORAL_TABLET | ORAL | Status: DC

## 2013-01-18 ENCOUNTER — Other Ambulatory Visit (INDEPENDENT_AMBULATORY_CARE_PROVIDER_SITE_OTHER): Payer: BC Managed Care – PPO

## 2013-01-18 DIAGNOSIS — Z13220 Encounter for screening for lipoid disorders: Secondary | ICD-10-CM

## 2013-01-18 DIAGNOSIS — Z125 Encounter for screening for malignant neoplasm of prostate: Secondary | ICD-10-CM

## 2013-01-18 DIAGNOSIS — Z79899 Other long term (current) drug therapy: Secondary | ICD-10-CM

## 2013-01-18 LAB — CBC WITH DIFFERENTIAL/PLATELET
Basophils Absolute: 0 10*3/uL (ref 0.0–0.1)
Basophils Relative: 0.6 % (ref 0.0–3.0)
Eosinophils Absolute: 0.1 10*3/uL (ref 0.0–0.7)
Eosinophils Relative: 2.3 % (ref 0.0–5.0)
HCT: 41.1 % (ref 39.0–52.0)
Hemoglobin: 13.4 g/dL (ref 13.0–17.0)
Lymphocytes Relative: 29.2 % (ref 12.0–46.0)
Lymphs Abs: 1.5 10*3/uL (ref 0.7–4.0)
MCHC: 32.7 g/dL (ref 30.0–36.0)
MCV: 96.7 fl (ref 78.0–100.0)
Monocytes Absolute: 0.5 10*3/uL (ref 0.1–1.0)
Monocytes Relative: 9.5 % (ref 3.0–12.0)
Neutro Abs: 2.9 10*3/uL (ref 1.4–7.7)
Neutrophils Relative %: 58.4 % (ref 43.0–77.0)
Platelets: 226 10*3/uL (ref 150.0–400.0)
RBC: 4.25 Mil/uL (ref 4.22–5.81)
RDW: 13.2 % (ref 11.5–14.6)
WBC: 5 10*3/uL (ref 4.5–10.5)

## 2013-01-18 LAB — BASIC METABOLIC PANEL
BUN: 16 mg/dL (ref 6–23)
CO2: 29 mEq/L (ref 19–32)
Calcium: 9.2 mg/dL (ref 8.4–10.5)
Chloride: 105 mEq/L (ref 96–112)
Creatinine, Ser: 0.9 mg/dL (ref 0.4–1.5)
GFR: 119.35 mL/min (ref >60.00)
Glucose, Bld: 89 mg/dL (ref 70–99)
Potassium: 4.3 mEq/L (ref 3.5–5.1)
Sodium: 138 mEq/L (ref 135–145)

## 2013-01-18 LAB — HEPATIC FUNCTION PANEL
ALT: 29 U/L (ref 0–53)
AST: 25 U/L (ref 0–37)
Albumin: 3.8 g/dL (ref 3.5–5.2)
Alkaline Phosphatase: 38 U/L — ABNORMAL LOW (ref 39–117)
Bilirubin, Direct: 0.1 mg/dL (ref 0.0–0.3)
Total Bilirubin: 1 mg/dL (ref 0.3–1.2)
Total Protein: 6.9 g/dL (ref 6.0–8.3)

## 2013-01-18 LAB — LIPID PANEL
Cholesterol: 181 mg/dL (ref 0–200)
HDL: 49.6 mg/dL (ref >39.00)
LDL Cholesterol: 115 mg/dL — ABNORMAL HIGH (ref 0–99)
Total CHOL/HDL Ratio: 4
Triglycerides: 83 mg/dL (ref 0.0–149.0)
VLDL: 16.6 mg/dL (ref 0.0–40.0)

## 2013-01-18 LAB — PSA: PSA: 0.26 ng/mL (ref 0.10–4.00)

## 2013-01-21 ENCOUNTER — Ambulatory Visit (INDEPENDENT_AMBULATORY_CARE_PROVIDER_SITE_OTHER): Payer: BC Managed Care – PPO | Admitting: Family Medicine

## 2013-01-21 ENCOUNTER — Encounter: Payer: Self-pay | Admitting: Family Medicine

## 2013-01-21 ENCOUNTER — Encounter: Payer: BC Managed Care – PPO | Admitting: Family Medicine

## 2013-01-21 VITALS — BP 120/80 | HR 65 | Temp 98.5°F | Ht 73.0 in | Wt 222.8 lb

## 2013-01-21 DIAGNOSIS — Z0000 Encounter for general adult medical examination without abnormal findings: Secondary | ICD-10-CM

## 2013-01-21 DIAGNOSIS — Z1211 Encounter for screening for malignant neoplasm of colon: Secondary | ICD-10-CM

## 2013-01-21 MED ORDER — SILDENAFIL CITRATE 100 MG PO TABS: ORAL_TABLET | ORAL | Status: DC

## 2013-01-21 NOTE — Patient Instructions (Addendum)
REFERRAL: GO THE THE FRONT ROOM AT THE ENTRANCE OF OUR CLINIC, NEAR CHECK IN. ASK FOR MARION. SHE WILL HELP YOU SET UP YOUR REFERRAL. DATE: TIME:  

## 2013-01-21 NOTE — Progress Notes (Signed)
[facility] at [facility] [address] [place] [address] Phone: [phone] Fax: [phone]  Date:  01/21/2013   Name:  Darius Solis   DOB:  [birth year]   MRN:  [id number] Gender: male Age: 52 y.o.  Primary Physician:  Hannah Beat, MD  Evaluating MD: Hannah Beat, MD   Chief Complaint: Annual Exam   History of Present Illness:  Darius Solis is a 52 y.o. pleasant patient who presents with the following:  CPX:  Thought maybe kidney pains, but has some back pain.   GI in [place].   Back is bothering him a little bit.  Knees.   Preventative Health Maintenance Visit:  Health Maintenance Summary Reviewed and updated, unless pt declines services.  Tobacco History Reviewed. Alcohol: No concerns, no excessive use Exercise Habits: Some activity, rec at least 30 mins 5 times a week STD concerns: no risk or activity to increase risk Drug Use: None Encouraged self-testicular check  Health Maintenance  Topic Date Due  . Colonoscopy  11/01/2012  . Influenza Vaccine  12/14/2012  . Tetanus/tdap  07/10/2019    Labs reviewed with the patient.  Results for orders placed in visit on 01/18/13  LIPID PANEL      Result Value Range   Cholesterol 181  0 - 200 mg/dL   Triglycerides 40.1  0.0 - 149.0 mg/dL   HDL 02.72  >53.66 mg/dL   VLDL 44.0  0.0 - 34.7 mg/dL   LDL Cholesterol 425 (*) 0 - 99 mg/dL   Total CHOL/HDL Ratio 4    HEPATIC FUNCTION PANEL      Result Value Range   Total Bilirubin 1.0  0.3 - 1.2 mg/dL   Bilirubin, Direct 0.1  0.0 - 0.3 mg/dL   Alkaline Phosphatase 38 (*) 39 - 117 U/L   AST 25  0 - 37 U/L   ALT 29  0 - 53 U/L   Total Protein 6.9  6.0 - 8.3 g/dL   Albumin 3.8  3.5 - 5.2 g/dL  PSA      Result Value Range   PSA 0.26  0.10 - 4.00 ng/mL  CBC WITH DIFFERENTIAL      Result Value Range   WBC 5.0  4.5 - 10.5 K/uL   RBC 4.25  4.22 - 5.81 Mil/uL   Hemoglobin 13.4  13.0 - 17.0 g/dL   HCT 95.6  38.7 - 56.4 %   MCV 96.7   78.0 - 100.0 fl   MCHC 32.7  30.0 - 36.0 g/dL   RDW 33.2  95.1 - 88.4 %   Platelets 226.0  150.0 - 400.0 K/uL   Neutrophils Relative % 58.4  43.0 - 77.0 %   Lymphocytes Relative 29.2  12.0 - 46.0 %   Monocytes Relative 9.5  3.0 - 12.0 %   Eosinophils Relative 2.3  0.0 - 5.0 %   Basophils Relative 0.6  0.0 - 3.0 %   Neutro Abs 2.9  1.4 - 7.7 K/uL   Lymphs Abs 1.5  0.7 - 4.0 K/uL   Monocytes Absolute 0.5  0.1 - 1.0 K/uL   Eosinophils Absolute 0.1  0.0 - 0.7 K/uL   Basophils Absolute 0.0  0.0 - 0.1 K/uL  BASIC METABOLIC PANEL      Result Value Range   Sodium 138  135 - 145 mEq/L   Potassium 4.3  3.5 - 5.1 mEq/L   Chloride 105  96 - 112 mEq/L   CO2 29  19 - 32 mEq/L  Glucose, Bld 89  70 - 99 mg/dL   BUN 16  6 - 23 mg/dL   Creatinine, Ser 0.9  0.4 - 1.5 mg/dL   Calcium 9.2  8.4 - 16.1 mg/dL   GFR 096.04  >54.09 mL/min     Patient Active Problem List   Diagnosis Date Noted  . FATIGUE 07/09/2009  . 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 CERUMEN IMPACTION, BILATERAL 06/04/2008  . MELENA 06/04/2008    Past Medical History  Diagnosis Date  . Allergy     No past surgical history on file.  History   Social History  . Marital Status: Married    Spouse Name: N/A    Number of Children: N/A  . Years of Education: N/A   Occupational History  . Not on file.   Social History Main Topics  . Smoking status: Former Games developer  . Smokeless tobacco: Never Used  . 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 Sexual Activity: Not on file   Other Topics Concern  . Not on file   Social History Narrative  . No narrative on file    No family history on file.  No Known Allergies  Medication list has been reviewed and updated.  Outpatient Prescriptions Prior to Visit  Medication Sig Dispense Refill  . sildenafil (VIAGRA) 100 MG tablet 1/2 to 1 tab po 30-60 minutes prior to sex as needed   10 tablet  11  . HYDROcodone-homatropine (HYCODAN) 5-1.5 MG/5ML syrup Take 5 mLs by mouth at bedtime as needed for cough.  120 mL  0   No facility-administered medications prior to visit.    Review of Systems:   General: Denies fever, chills, sweats. No significant weight loss. Eyes: Denies blurring,significant itching ENT: Denies earache, sore throat, and hoarseness. Cardiovascular: Denies chest pains, palpitations, dyspnea on exertion Respiratory: Denies cough, dyspnea at rest,wheeezing Breast: no concerns about lumps GI: Denies nausea, vomiting, diarrhea, constipation, change in bowel habits, abdominal pain, melena, hematochezia GU: Denies penile discharge, ED, urinary flow / outflow problems. No STD concerns. Musculoskeletal: Some back pain and knee pain Derm: Denies rash, itching Neuro: Denies  paresthesias, frequent falls, frequent headaches Psych: Denies depression, anxiety Endocrine: Denies cold intolerance, heat intolerance, polydipsia Heme: Denies enlarged lymph nodes Allergy: No hayfever   Physical Examination: BP 120/80  Pulse 65  Temp(Src) 98.5 F (36.9 C) (Oral)  Ht 6\' 1"  (1.854 m)  Wt 222 lb 12 oz (101.039 kg)  BMI 29.39 kg/m2  Ideal Body Weight: Weight in (lb) to have BMI = 25: 189.1   Wt Readings from Last 3 Encounters:  01/21/13 222 lb 12 oz (101.039 kg)  06/22/12 220 lb 12 oz (100.132 kg)  03/02/11 203 lb 12.8 oz (92.443 kg)    GEN: well developed, well nourished, no acute distress Eyes: conjunctiva and lids normal, PERRLA, EOMI ENT: TM clear, nares clear, oral exam WNL Neck: supple, no lymphadenopathy, no thyromegaly, no JVD Pulm: clear to auscultation and percussion, respiratory effort normal CV: regular rate and rhythm, S1-S2, no murmur, rub or gallop, no bruits, peripheral pulses normal and symmetric, no cyanosis, clubbing, edema or varicosities Chest: no scars, masses GI: soft, non-tender; no hepatosplenomegaly, masses; active bowel sounds  all quadrants GU: no hernia, testicular mass, penile discharge, or prostate enlargement Lymph: no cervical, axillary or inguinal adenopathy MSK: gait normal, muscle tone and strength WNL, no  joint swelling, effusions, discoloration, crepitus  SKIN: clear, good turgor, color WNL, no rashes, lesions, or ulcerations Neuro: normal mental status, normal strength, sensation, and motion Psych: alert; oriented to person, place and time, normally interactive and not anxious or depressed in appearance.  Assessment and Plan:  Routine general medical examination at a health care facility  Special screening for malignant neoplasms, colon - Plan: Ambulatory referral to Gastroenterology  The patient's preventative maintenance and recommended screening tests for an annual wellness exam were reviewed in full today. Brought up to date unless services declined.  Counselled on the importance of diet, exercise, and its role in overall health and mortality. The patient's FH and SH was reviewed, including their home life, tobacco status, and drug and alcohol status.   [facility] rehab, back and knee  Orders Today:  Orders Placed This Encounter  Procedures  . Ambulatory referral to Gastroenterology    Referral Priority:  Routine    Referral Type:  Consultation    Referral Reason:  Specialty Services Required    Requested Specialty:  Gastroenterology    Number of Visits Requested:  1    Updated Medication List: (Includes new medications, updates to list, dose adjustments) Meds ordered this encounter  Medications  . sildenafil (VIAGRA) 100 MG tablet    Sig: 1/2 to 1 tab po 30-60 minutes prior to sex as needed    Dispense:  10 tablet    Refill:  11    Medications Discontinued: Medications Discontinued During This Encounter  Medication Reason  . HYDROcodone-homatropine (HYCODAN) 5-1.5 MG/5ML syrup Completed Course  . sildenafil (VIAGRA) 100 MG tablet Reorder      Signed, Karleen Hampshire T. Edric Fetterman,  MD 01/21/2013 12:19 PM

## 2013-04-24 ENCOUNTER — Ambulatory Visit (INDEPENDENT_AMBULATORY_CARE_PROVIDER_SITE_OTHER): Payer: BC Managed Care – PPO | Admitting: Family Medicine

## 2013-04-24 ENCOUNTER — Encounter: Payer: Self-pay | Admitting: Family Medicine

## 2013-04-24 VITALS — BP 102/64 | HR 63 | Temp 98.2°F | Ht 73.0 in | Wt 223.8 lb

## 2013-04-24 DIAGNOSIS — M545 Low back pain, unspecified: Secondary | ICD-10-CM

## 2013-04-24 DIAGNOSIS — Z23 Encounter for immunization: Secondary | ICD-10-CM

## 2013-04-24 DIAGNOSIS — M533 Sacrococcygeal disorders, not elsewhere classified: Secondary | ICD-10-CM

## 2013-04-24 LAB — POCT URINALYSIS DIPSTICK
Bilirubin, UA: NEGATIVE
Glucose, UA: NEGATIVE
Ketones, UA: NEGATIVE
Leukocytes, UA: NEGATIVE
Nitrite, UA: NEGATIVE
Spec Grav, UA: 1.015
Urobilinogen, UA: 0.2
pH, UA: 6

## 2013-04-24 MED ORDER — DICLOFENAC SODIUM 75 MG PO TBEC: 75.0000 mg | DELAYED_RELEASE_TABLET | Freq: Two times a day (BID) | ORAL | Status: DC

## 2013-04-24 NOTE — Progress Notes (Signed)
Pre-visit discussion using our clinic review tool. No additional management support is needed unless otherwise documented below in the visit note.  

## 2013-04-24 NOTE — Progress Notes (Signed)
   Patient Name: Darius Solis Date of Birth: 10/04/1960 Medical Record Number: [id number] Gender: male  PCP: Hannah Beat, MD  History of Present Illness:  Darius Solis is a 52 y.o. very pleasant male patient who presents with the following: Back Pain  ongoing for approximately: 1 mo The patient has had back pain before. The back pain is localized into the lumbar spine area. They also describe no radiculopathy. 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 Since 03/26/2013.  Using a hook on his job to help.   L SI joint  No numbness or tingling. No bowel or bladder incontinence. No focal weakness. Prior interventions: none Physical therapy: No Chiropractic manipulations: No Acupuncture: No Osteopathic manipulation: No Heat or cold: Minimal effect  Past Medical History, Surgical History, Family History, Medications, Allergies have been reviewed and updated if relevant.  Review of Systems  GEN: No fevers, chills. Nontoxic. Primarily MSK c/o today. MSK: Detailed in the HPI GI: tolerating PO intake without difficulty Neuro: As above  Otherwise the pertinent positives of the ROS are noted above.    Physical Exam  Filed Vitals:   04/24/13 0807  BP: 102/64  Pulse: 63  Temp: 98.2 F (36.8 C)  TempSrc: Oral  Height: 6\' 1"  (1.854 m)  Weight: 223 lb 12 oz (101.492 kg)  SpO2: 98%    Gen: Well-developed,well-nourished,in no acute distress; alert,appropriate and cooperative throughout examination HEENT: Normocephalic and atraumatic without obvious abnormalities.  Ears, externally no deformities Pulm: Breathing comfortably in no respiratory distress Range of motion at  the waist: Flexion, rotation and lateral bending: full  No echymosis or edema Rises to examination table with no difficulty Gait: minimally antalgic  Inspection/Deformity: No abnormality Paraspinus T:  l3-s1, mild  B Ankle Dorsiflexion (L5,4): 5/5 B Great Toe Dorsiflexion (L5,4):  5/5 Heel Walk (L5): WNL Toe Walk (S1): WNL Rise/Squat (L4): WNL, mild pain  SENSORY B Medial Foot (L4): WNL B Dorsum (L5): WNL B Lateral (S1): WNL Light Touch: WNL Pinprick: WNL  REFLEXES Knee (L4): 2+ Ankle (S1): 2+  B SLR, seated: neg B SLR, supine: neg B FABER: neg B Reverse FABER: neg B Greater Troch: NT B Log Roll: neg B Stork: NT B Sciatic Notch: L SI JOINT  Lower back pain - Plan: Urinalysis Dipstick  Need for prophylactic vaccination and inoculation against influenza - Plan: Flu Vaccine QUAD 36+ mos PF IM (Fluarix)  Sacroiliac joint dysfunction    I reviewed with the patient the structures involved and how they related to diagnosis.   [facility] re[facility] guide given  If not progressing, these modalities can be helpful in select cases. Chiropractic or Osteopathic Manipulation Accupuncture  Start with medications, core rehab, and progress from there following low back pain algorithm. No red flags are present.   Meds ordered this encounter  Medications  . Multiple Vitamins-Minerals (MULTIVITAL) tablet    Sig: Take 1 tablet by mouth daily.  . diclofenac (VOLTAREN) 75 MG EC tablet    Sig: Take 1 tablet (75 mg total) by mouth 2 (two) times daily.    Dispense:  60 tablet    Refill:  3

## 2013-07-24 ENCOUNTER — Ambulatory Visit (INDEPENDENT_AMBULATORY_CARE_PROVIDER_SITE_OTHER): Payer: BC Managed Care – PPO | Admitting: Podiatry

## 2013-07-24 ENCOUNTER — Ambulatory Visit: Payer: Self-pay | Admitting: Podiatry

## 2013-07-24 ENCOUNTER — Encounter: Payer: Self-pay | Admitting: Podiatry

## 2013-07-24 ENCOUNTER — Ambulatory Visit (INDEPENDENT_AMBULATORY_CARE_PROVIDER_SITE_OTHER): Payer: BC Managed Care – PPO

## 2013-07-24 VITALS — BP 113/82 | HR 73 | Resp 16 | Ht 73.0 in | Wt 220.0 lb

## 2013-07-24 DIAGNOSIS — M79609 Pain in unspecified limb: Secondary | ICD-10-CM

## 2013-07-24 DIAGNOSIS — M79673 Pain in unspecified foot: Secondary | ICD-10-CM

## 2013-07-24 DIAGNOSIS — M205X1 Other deformities of toe(s) (acquired), right foot: Secondary | ICD-10-CM

## 2013-07-24 DIAGNOSIS — M778 Other enthesopathies, not elsewhere classified: Secondary | ICD-10-CM

## 2013-07-24 DIAGNOSIS — M7750 Other enthesopathy of unspecified foot: Secondary | ICD-10-CM

## 2013-07-24 DIAGNOSIS — M205X9 Other deformities of toe(s) (acquired), unspecified foot: Secondary | ICD-10-CM

## 2013-07-24 DIAGNOSIS — B351 Tinea unguium: Secondary | ICD-10-CM

## 2013-07-24 DIAGNOSIS — M779 Enthesopathy, unspecified: Secondary | ICD-10-CM

## 2013-07-24 NOTE — Progress Notes (Signed)
   Subjective:    Patient ID: Glorious PeachPernell Snelling, male    DOB: 06-11-1960, 53 y.o.   MRN: [id number]  HPI Comments: i want my toenails trimmed. im having pain in my big toe on my right foot around the joint area. Its been going off and on for 2 yrs. The pain is about the same and i just deal with it. i dont do anything for my toe. i do trim my toenails.  Foot Pain      Review of Systems  Constitutional: Negative.   HENT:       Sinus problems  Eyes: Negative.   Respiratory: Negative.   Cardiovascular: Negative.   Gastrointestinal: Negative.   Endocrine: Negative.   Genitourinary: Positive for difficulty urinating.  Musculoskeletal:       Joint pain  Skin: Negative.   Allergic/Immunologic: Negative.   Neurological: Negative.   Hematological: Negative.   Psychiatric/Behavioral: Negative.        Objective:   Physical Exam: I reviewed his past medical history medications allergies surgeries social history. Pulses are strongly palpable bilateral neuromuscular structures are intact. Orthopedic evaluation Mr. is all joints distal to the ankle a full range of motion without crepitation with exception of the first metatarsophalangeal joint of the right foot which is limited on dorsiflexion. His pain in this area that is palpable. Radiographic evaluation does to demonstrate joint space narrowing some sclerosis and dorsal spurring on lateral view of the first metatarsal head. His nails are thick yellow dystrophic lytic mycotic and painful palpation as well as debridement.   Assessment: Pain in limb secondary to onychomycosis 1 through 5 bilateral. 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  Plan: Injected the first metatarsophalangeal joint today with dexamethasone and local anesthetic. I debrided his nails 1 through 5 bilateral.        Assessment & Plan:

## 2013-11-11 ENCOUNTER — Ambulatory Visit (INDEPENDENT_AMBULATORY_CARE_PROVIDER_SITE_OTHER): Payer: BC Managed Care – PPO | Admitting: Podiatry

## 2013-11-11 VITALS — BP 115/74 | HR 78 | Resp 16

## 2013-11-11 DIAGNOSIS — M205X9 Other deformities of toe(s) (acquired), unspecified foot: Secondary | ICD-10-CM

## 2013-11-11 DIAGNOSIS — M79609 Pain in unspecified limb: Secondary | ICD-10-CM

## 2013-11-11 DIAGNOSIS — B351 Tinea unguium: Secondary | ICD-10-CM

## 2013-11-11 DIAGNOSIS — M79673 Pain in unspecified foot: Secondary | ICD-10-CM

## 2013-11-11 DIAGNOSIS — M205X1 Other deformities of toe(s) (acquired), right foot: Secondary | ICD-10-CM

## 2013-11-11 NOTE — Progress Notes (Signed)
He presents today for a surgical consult regarding his first metatarsophalangeal joint and also to have his thick mycotic nails trimmed.  Objective: Vital signs are stable he is alert and oriented x3. Pulses are strongly palpable bilateral. He has limited range of motion without crepitation to the first metatarsophalangeal joint of the right foot. Radiographic evaluation demonstrates dorsal spurring joint space narrowing subchondral sclerosis and subchondral eburnation. Cystic disease is also noted. His nails are thick yellow dystrophic with mycotic and painful palpation as well as debridement.  Assessment: Pain in limb secondary to onychomycosis 1 through 5 bilateral. 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  Plan: We discussed the etiology pathology conservative versus surgical therapies step-by-step whenever the consent form today line bylined number by number giving him ample time to ask questions he saw fit regarding a Keller arthroplasty with single silicone implant. I answered his questions to the best of my ability in layman's terms. He understood it was amenable to it and signed all 3 pages of the consent form. He was dispensed a Cam Walker today his nails are debrided 1 through 5 bilateral is cover service secondary to pain I will followup with him in the near future for surgery.

## 2014-01-09 ENCOUNTER — Telehealth: Payer: Self-pay | Admitting: Family Medicine

## 2014-01-09 NOTE — Telephone Encounter (Signed)
01/21/2013 a colon referral was made to Dr. Mechele Collin.

## 2014-01-09 NOTE — Telephone Encounter (Signed)
Pt walked in today wanting to get a colonscopy scheduled.  There is no order in system Pt would like to go to [facility]

## 2014-01-13 NOTE — Telephone Encounter (Signed)
Referral request faxed again to [facility] in [place], they will call the patient to schedule.

## 2014-02-19 ENCOUNTER — Ambulatory Visit: Payer: BC Managed Care – PPO | Admitting: Podiatry

## 2014-03-07 ENCOUNTER — Ambulatory Visit: Payer: Self-pay | Admitting: Gastroenterology

## 2014-03-07 LAB — HM COLONOSCOPY: HM Colonoscopy: NORMAL

## 2014-03-18 ENCOUNTER — Encounter: Payer: Self-pay | Admitting: Family Medicine

## 2014-03-27 ENCOUNTER — Other Ambulatory Visit: Payer: Self-pay

## 2014-03-27 MED ORDER — SILDENAFIL CITRATE 100 MG PO TABS: ORAL_TABLET | ORAL | Status: DC

## 2014-03-27 NOTE — Telephone Encounter (Signed)
Pt left note requesting refill viagra to [facility] pharmacy; last seen 04/24/2013 and no future appt scheduled.Please advise.

## 2014-06-16 ENCOUNTER — Telehealth: Payer: Self-pay | Admitting: *Deleted

## 2014-06-16 NOTE — Telephone Encounter (Signed)
"  I'd like to schedule surgery."

## 2014-06-25 ENCOUNTER — Encounter: Payer: Self-pay | Admitting: Podiatry

## 2014-06-25 ENCOUNTER — Ambulatory Visit (INDEPENDENT_AMBULATORY_CARE_PROVIDER_SITE_OTHER): Payer: Federal, State, Local not specified - PPO | Admitting: Podiatry

## 2014-06-25 VITALS — BP 142/86 | HR 82 | Resp 12

## 2014-06-25 DIAGNOSIS — M205X1 Other deformities of toe(s) (acquired), right foot: Secondary | ICD-10-CM

## 2014-06-25 NOTE — Progress Notes (Signed)
He presents today for another discussion regarding his Lorenz CoasterKeller arthroplasty and single silicone implant which she was scheduled for last year. At this point we went over the consent form today once again line by line number by number giving him ample time to ask questions he saw fit regarding a Keller arthroplasty with a single silicone implant right foot. We did evaluate the x-rays once again on computer and he understands this is amenable to it. We did discuss the possible postop complications which may include but are not limited to postop pain bleeding swelling infection need for further surgery loss of digit loss limb loss of life overcorrection under correction and loss of motion. He saw Dr. pace in the consent form we redistributed the paperwork necessary for the surgical center and we will follow-up with him in the near future for surgical intervention.

## 2014-07-23 ENCOUNTER — Encounter: Payer: Self-pay | Admitting: Primary Care

## 2014-07-23 ENCOUNTER — Ambulatory Visit (INDEPENDENT_AMBULATORY_CARE_PROVIDER_SITE_OTHER): Payer: Federal, State, Local not specified - PPO | Admitting: Primary Care

## 2014-07-23 VITALS — BP 136/82 | HR 59 | Temp 98.8°F | Ht 73.0 in | Wt 230.0 lb

## 2014-07-23 DIAGNOSIS — J069 Acute upper respiratory infection, unspecified: Secondary | ICD-10-CM | POA: Diagnosis not present

## 2014-07-23 DIAGNOSIS — J029 Acute pharyngitis, unspecified: Secondary | ICD-10-CM | POA: Diagnosis not present

## 2014-07-23 LAB — POCT RAPID STREP A (OFFICE): Rapid Strep A Screen: NEGATIVE

## 2014-07-23 MED ORDER — BENZONATATE 100 MG PO CAPS: 100.0000 mg | ORAL_CAPSULE | Freq: Three times a day (TID) | ORAL | Status: DC | PRN

## 2014-07-23 NOTE — Addendum Note (Signed)
Addended by: Tawnya CrookSAMBATH, Caterine Mcmeans on: 07/23/2014 09:53 AM   Modules accepted: Orders

## 2014-07-23 NOTE — Assessment & Plan Note (Addendum)
Suspect this is a viral infection that will resolve. Rapid strep was negative. Supportive measures provided along with RX for tessalon pearls for cough. Instructed patient not to take the Voltaren and Advil together. Follow up as needed.

## 2014-07-23 NOTE — Patient Instructions (Addendum)
You strep test was negative. Take Tessalon Pearls three times daily for cough. You make take tylenol for fevers and chills. Drink plenty of water to replace dehydration from coughing. Use an OTC chloraseptic spray for throat pain and do not take ibuprofen or advil with the Voltaren. Call me if you no improvement in the next 3 to 4 days or if symptoms worsen. I hope you feel better soon!

## 2014-07-23 NOTE — Progress Notes (Signed)
Subjective:    Patient ID: Darius Solis, male    DOB: 1960/12/06, 54 y.o.   MRN: [id number]  HPI  Mr. Darius Solis is a 54 year old male who presents today with a chief complaint of productive cough (clear mucus), sore throat, and mild ethmoid sinus pressure x 2 weeks. Reports an episode of chills, denies fevers, body aches. He's tried taking an OTC "sinus and fever" medication, nyquil, and advil PM with temporary relief. Nothing in particular worsens symptoms. His wife had the same symptoms several weeks ago which resolved on their own. His main concern today is the soreness in his through and cough.  Review of Systems  Constitutional: Positive for chills and fatigue.  HENT: Positive for sinus pressure. Negative for ear pain and postnasal drip.   Respiratory: Positive for cough. Negative for shortness of breath.   Cardiovascular: Negative for chest pain.  Gastrointestinal: Negative for nausea and vomiting.  Musculoskeletal: Positive for gait problem.  Neurological: Positive for headaches. Negative for dizziness.  Hematological: Positive for adenopathy.       Past Medical History  Diagnosis Date  . Allergy     History   Social History  . Marital Status: Married    Spouse Name: N/A  . Number of Children: N/A  . Years of Education: N/A   Occupational History  . Not on file.   Social History Main Topics  . Smoking status: Former Games developermoker  . Smokeless tobacco: Never Used  . 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 Sexual Activity: Not on file   Other Topics Concern  . Not on file   Social History Narrative    No past surgical history on file.  No family history on file.  No Known Allergies  Current Outpatient Prescriptions on File Prior to Visit  Medication Sig Dispense Refill  . diclofenac (VOLTAREN) 75 MG EC tablet Take 1 tablet (75 mg total) by mouth 2 (two)  times daily. 60 tablet 3  . folic acid (FOLVITE) 1 MG tablet   11  . methotrexate (RHEUMATREX) 2.5 MG tablet   1  . Multiple Vitamins-Minerals (MULTIVITAL) tablet Take 1 tablet by mouth daily.    . sildenafil (VIAGRA) 100 MG tablet 1/2 to 1 tab po 30-60 minutes prior to sex as needed 10 tablet 11   No current facility-administered medications on file prior to visit.    BP 136/82 mmHg  Pulse 59  Temp(Src) 98.8 F (37.1 C) (Oral)  Ht 6\' 1"  (1.854 m)  Wt 230 lb (104.327 kg)  BMI 30.35 kg/m2  SpO2 97%    Objective:   Physical Exam  Constitutional: He is oriented to person, place, and time. He appears well-developed.  HENT:  Head: Normocephalic.  Right Ear: External ear normal.  Left Ear: External ear normal.  Cerumen buildup bilaterally  Eyes: Conjunctivae are normal. Pupils are equal, round, and reactive to light.  Neck: Neck supple.  Cardiovascular: Normal rate and normal heart sounds.   Pulmonary/Chest: Effort normal and breath sounds normal. He has no wheezes. He has no rales.  Lymphadenopathy:    He has no cervical adenopathy.  Neurological: He is alert and oriented to person, place, and time.  Skin: Skin is warm and dry.  Psychiatric: He has a normal mood and affect.          Assessment & Plan:  Rapid Strep  - Negative

## 2014-07-23 NOTE — Progress Notes (Signed)
Pre visit review using our clinic review tool, if applicable. No additional management support is needed unless otherwise documented below in the visit note. 

## 2014-08-21 ENCOUNTER — Other Ambulatory Visit: Payer: Self-pay | Admitting: Podiatry

## 2014-08-21 MED ORDER — CEPHALEXIN 500 MG PO CAPS: 500.0000 mg | ORAL_CAPSULE | Freq: Three times a day (TID) | ORAL | Status: DC

## 2014-08-21 MED ORDER — PROMETHAZINE HCL 25 MG PO TABS: 25.0000 mg | ORAL_TABLET | Freq: Three times a day (TID) | ORAL | Status: DC | PRN

## 2014-08-21 MED ORDER — OXYCODONE-ACETAMINOPHEN 10-325 MG PO TABS: ORAL_TABLET | ORAL | Status: DC

## 2014-08-22 DIAGNOSIS — M2011 Hallux valgus (acquired), right foot: Secondary | ICD-10-CM | POA: Diagnosis not present

## 2014-08-27 ENCOUNTER — Ambulatory Visit (INDEPENDENT_AMBULATORY_CARE_PROVIDER_SITE_OTHER): Payer: Federal, State, Local not specified - PPO | Admitting: Podiatry

## 2014-08-27 ENCOUNTER — Ambulatory Visit (INDEPENDENT_AMBULATORY_CARE_PROVIDER_SITE_OTHER): Payer: Federal, State, Local not specified - PPO

## 2014-08-27 VITALS — BP 134/92 | HR 108 | Temp 99.0°F | Resp 18

## 2014-08-27 DIAGNOSIS — M205X1 Other deformities of toe(s) (acquired), right foot: Secondary | ICD-10-CM

## 2014-08-27 DIAGNOSIS — Z9889 Other specified postprocedural states: Secondary | ICD-10-CM

## 2014-08-27 NOTE — Progress Notes (Signed)
He presents today for his first postop visit status post Keller arthroplasty with single silicone implant first metatarsophalangeal joint right foot. He denies fever chills nausea vomiting muscle aches and pains. He states that the foot has been sore particularly every time it is dependent.  Objective: Vital signs are stable he is alert and oriented 3. Pulses are palpable. Dry sterile dressing was intact. Was removed and reveals moderate edema no erythema no cellulitis drainage or odor incision appears to be intact with sutures and he has tenderness on passive range of motion of the first metatarsophalangeal joint. Radiographically the joint appears to be in good position and no signs of infection.  Assessment: Well-healing surgical right foot status post Keller arthroplasty with a single silicone implant 08/22/2014  Plan: Encouraged range of motion exercises. Redressed the foot today he tolerated procedure well and I will follow-up with him in 1 week

## 2014-09-03 ENCOUNTER — Encounter: Payer: Federal, State, Local not specified - PPO | Admitting: Podiatry

## 2014-09-03 ENCOUNTER — Ambulatory Visit (INDEPENDENT_AMBULATORY_CARE_PROVIDER_SITE_OTHER): Payer: Federal, State, Local not specified - PPO | Admitting: Podiatry

## 2014-09-03 VITALS — BP 139/90 | HR 80 | Resp 16

## 2014-09-03 DIAGNOSIS — Z9889 Other specified postprocedural states: Secondary | ICD-10-CM

## 2014-09-03 MED ORDER — PROMETHAZINE HCL 25 MG PO TABS: 25.0000 mg | ORAL_TABLET | Freq: Three times a day (TID) | ORAL | Status: DC | PRN

## 2014-09-03 NOTE — Progress Notes (Signed)
He presents today 2 weeks status post Keller arthroplasty with single silicone implant right foot. He states this seems to be doing much better than it did last week I can actually move my toes.  Objective: Vital signs are stable he is alert and oriented 3. Pulses are palpable. Dry sterile dressing intact was removed does demonstrates some dry blood to the dressing and the slight dehiscence distally more than likely secondary to edema. He has good range of motion on passive range of motion at the first metatarsophalangeal joint right foot.  Assessment: Well-healing surgical foot right.  Plan: Follow up with him in 1 week for suture removal and placed him in a Darco shoe today. He stated this felt much better as he was leaving the office.

## 2014-09-10 ENCOUNTER — Ambulatory Visit (INDEPENDENT_AMBULATORY_CARE_PROVIDER_SITE_OTHER): Payer: Federal, State, Local not specified - PPO

## 2014-09-10 ENCOUNTER — Encounter: Payer: Self-pay | Admitting: Podiatry

## 2014-09-10 ENCOUNTER — Ambulatory Visit (INDEPENDENT_AMBULATORY_CARE_PROVIDER_SITE_OTHER): Payer: Federal, State, Local not specified - PPO | Admitting: Podiatry

## 2014-09-10 VITALS — Ht 73.0 in | Wt 220.0 lb

## 2014-09-10 DIAGNOSIS — Z9889 Other specified postprocedural states: Secondary | ICD-10-CM

## 2014-09-10 DIAGNOSIS — M205X1 Other deformities of toe(s) (acquired), right foot: Secondary | ICD-10-CM

## 2014-09-10 NOTE — Progress Notes (Signed)
He presents today for follow-up of Darius Solis arthroplasty with a single silicone implant. Date of surgery was 08/22/2014. He states it is still quite tender.  Objective: Dry sterile dressing intact was removed reveals strong palpable pulses right foot. Much decreased edema to the right foot. He still has pain on palpation and range of motion of the first metatarsophalangeal joint with a mild dehiscence of the proximal wound. Sutures remained well intact.  Assessment: Well-healing surgical foot slightly delayed due to a hematoma immediately postop however the does seem to be progressively resolving.  Plan: Demonstrated to him and his wife today how I expect them to perform physical activities and exercises to the foot. I also removed the suture edges today which will allow some wound relaxation. I will him to start soaking this twice daily and Epsom salts and warm water and applying lotion to the foot. We put him in a compression sock and I will follow-up with him in 2 weeks

## 2014-09-16 ENCOUNTER — Other Ambulatory Visit: Payer: Self-pay | Admitting: Podiatry

## 2014-09-16 MED ORDER — OXYCODONE-ACETAMINOPHEN 10-325 MG PO TABS: ORAL_TABLET | ORAL | Status: DC

## 2014-09-16 NOTE — Telephone Encounter (Signed)
Please refill his percocet today by five or have someone pick it up in the morning.

## 2014-09-16 NOTE — Telephone Encounter (Signed)
PATIENTS WIFE CAME TO THE OFFICE ASKING FOR MED REFILL ON OXYCODONE FOR SPOUSE. PLEASE ADVISE.

## 2014-09-24 ENCOUNTER — Encounter: Payer: Federal, State, Local not specified - PPO | Admitting: Podiatry

## 2014-09-29 ENCOUNTER — Ambulatory Visit (INDEPENDENT_AMBULATORY_CARE_PROVIDER_SITE_OTHER): Payer: Federal, State, Local not specified - PPO | Admitting: Podiatry

## 2014-09-29 ENCOUNTER — Ambulatory Visit (INDEPENDENT_AMBULATORY_CARE_PROVIDER_SITE_OTHER): Payer: Federal, State, Local not specified - PPO

## 2014-09-29 ENCOUNTER — Encounter: Payer: Self-pay | Admitting: Podiatry

## 2014-09-29 VITALS — BP 132/89 | HR 73 | Resp 16

## 2014-09-29 DIAGNOSIS — M205X1 Other deformities of toe(s) (acquired), right foot: Secondary | ICD-10-CM | POA: Diagnosis not present

## 2014-09-29 DIAGNOSIS — Z9889 Other specified postprocedural states: Secondary | ICD-10-CM

## 2014-09-29 MED ORDER — PROMETHAZINE HCL 25 MG PO TABS: 25.0000 mg | ORAL_TABLET | Freq: Three times a day (TID) | ORAL | Status: DC | PRN

## 2014-09-29 NOTE — Progress Notes (Signed)
He presents today for postop visit regarding a Keller arthroplasty with a single silicone implant right foot date of surgery was 08/22/2014. He denies fever chills nausea vomiting muscle aches and pains. States that seems to be progressing quite well now. He relates some numbness to the dorsal medial aspect of the incision site.  Objective: Vital signs are stable he is alert and oriented 3. Pulses are strongly palpable right foot. Neurologic sensorium is intact. Good range of motion first metatarsophalangeal joint. Tight skin overlying the surgical site indicative of considerable scar tissue. Radiograph confirms much decrease in edema without bony regrowth.  Assessment: 6 weeks status post status post Keller arthroplasty single silicone implant left foot. Follow up with me in 3 weeks prior to going back to work. I did encourage him to get back into a pair of tennis shoes at this point.

## 2014-10-22 ENCOUNTER — Ambulatory Visit (INDEPENDENT_AMBULATORY_CARE_PROVIDER_SITE_OTHER): Payer: Federal, State, Local not specified - PPO

## 2014-10-22 ENCOUNTER — Ambulatory Visit (INDEPENDENT_AMBULATORY_CARE_PROVIDER_SITE_OTHER): Payer: Federal, State, Local not specified - PPO | Admitting: Podiatry

## 2014-10-22 ENCOUNTER — Encounter: Payer: Self-pay | Admitting: Podiatry

## 2014-10-22 ENCOUNTER — Other Ambulatory Visit: Payer: Self-pay | Admitting: Podiatry

## 2014-10-22 VITALS — BP 119/85 | HR 73 | Resp 18 | Ht 74.0 in | Wt 220.0 lb

## 2014-10-22 DIAGNOSIS — Z9889 Other specified postprocedural states: Secondary | ICD-10-CM

## 2014-10-22 DIAGNOSIS — M205X1 Other deformities of toe(s) (acquired), right foot: Secondary | ICD-10-CM

## 2014-10-22 NOTE — Progress Notes (Signed)
He presents today for follow-up of his Lorenz CoasterKeller arthroplasty first metatarsophalangeal joint of the right foot. He states that seems to be getting better all the time. He states that some of the numbness has started to subside at this point. He's back into regular work boots at this point.  Objective: Vital signs are stable he is alert and oriented 3. Pulses are strongly palpable. He has great range of motion of the first metatarsophalangeal joint. Radiographic evaluation revealed swelling first metatarsophalangeal joint no bony overgrowth and good placement of the implant.  Assessment: Well-healing surgical foot first metatarsophalangeal joint.  Plan: Encouraged range of motion exercises and massage therapy of the scar get back into regular shoe gear and start back to work I will follow-up with him in 1 month.

## 2014-11-19 ENCOUNTER — Encounter: Payer: Self-pay | Admitting: Podiatry

## 2014-11-19 ENCOUNTER — Ambulatory Visit (INDEPENDENT_AMBULATORY_CARE_PROVIDER_SITE_OTHER): Payer: Federal, State, Local not specified - PPO

## 2014-11-19 ENCOUNTER — Ambulatory Visit (INDEPENDENT_AMBULATORY_CARE_PROVIDER_SITE_OTHER): Payer: Federal, State, Local not specified - PPO | Admitting: Podiatry

## 2014-11-19 VITALS — BP 122/76 | HR 59 | Resp 16

## 2014-11-19 DIAGNOSIS — Z9889 Other specified postprocedural states: Secondary | ICD-10-CM

## 2014-11-19 DIAGNOSIS — B351 Tinea unguium: Secondary | ICD-10-CM

## 2014-11-19 DIAGNOSIS — M79673 Pain in unspecified foot: Secondary | ICD-10-CM | POA: Diagnosis not present

## 2014-11-19 DIAGNOSIS — M205X1 Other deformities of toe(s) (acquired), right foot: Secondary | ICD-10-CM

## 2014-11-19 NOTE — Progress Notes (Signed)
He presents today for follow-up of his Lorenz CoasterKeller arthroplasty first metatarsophalangeal joint of the right foot. He states he is back to work and that is causing him a little bit of discomfort.  Objective: Vital signs are stable he is alert and oriented 3. Pulses are strongly palpable. He has great range of motion of the first metatarsophalangeal joint. Radiographic evaluation reveals good placement of the implant and no bony overgrowth. Toenails were elongated and trimmed today as he hadn't been able to get his regular pedicures.   Assessment: Well-healing surgical foot first metatarsophalangeal joint right  Plan: Continue range of motion exercises and massage therapy of the scar. Debrided his thickened nails today.

## 2014-12-24 ENCOUNTER — Encounter: Payer: Self-pay | Admitting: Family Medicine

## 2014-12-24 ENCOUNTER — Encounter: Payer: Federal, State, Local not specified - PPO | Admitting: Podiatry

## 2014-12-24 ENCOUNTER — Ambulatory Visit: Payer: Self-pay

## 2014-12-24 ENCOUNTER — Ambulatory Visit (INDEPENDENT_AMBULATORY_CARE_PROVIDER_SITE_OTHER): Payer: Federal, State, Local not specified - PPO | Admitting: Family Medicine

## 2014-12-24 VITALS — BP 118/78 | HR 58 | Temp 98.5°F | Wt 224.5 lb

## 2014-12-24 DIAGNOSIS — N481 Balanitis: Secondary | ICD-10-CM | POA: Diagnosis not present

## 2014-12-24 MED ORDER — SILDENAFIL CITRATE 20 MG PO TABS: ORAL_TABLET | ORAL | Status: DC

## 2014-12-24 NOTE — Progress Notes (Signed)
Dr. Karleen Hampshire T. Haya Hemler, MD, [facility] [address] [place], [address] Phone: [phone] Fax: [phone]  12/24/2014  Patient: Darius Solis, MRN: [id number], DOB: 08/27/1960, 54 y.o.  Primary Physician:  Hannah Beat, MD  Chief Complaint: Rash  Subjective:   Darius Solis is a 54 y.o. very pleasant male patient who presents with the following:  S/p R great MTP surgery.   On penis, underneath corona and adjacent skin, rash with burning and some whitish change. Has not tried anything Faithful to 1 woman.  Past Medical History, Surgical History, Social History, Family History, Problem List, Medications, and Allergies have been reviewed and updated if relevant.  Patient Active Problem List   Diagnosis Date Noted  . Acute upper respiratory infection 07/23/2014  . FATIGUE 07/09/2009  . 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 CERUMEN IMPACTION, BILATERAL 06/04/2008  . MELENA 06/04/2008    Past Medical History  Diagnosis Date  . Allergy     No past surgical history on file.  Social History   Social History  . Marital Status: Married    Spouse Name: N/A  . Number of Children: N/A  . Years of Education: N/A   Occupational History  . Not on file.   Social History Main Topics  . Smoking status: Former Games developer  . Smokeless tobacco: Never Used  . 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 Sexual Activity: Not on file   Other Topics Concern  . Not on file   Social History Narrative    No family history on file.  No Known Allergies  Medication list reviewed and updated in full in [facility].  ROS: GEN: Acute illness details above GI: Tolerating PO intake GU: maintaining adequate hydration and urination Pulm: No SOB Interactive and getting along well at home.  Otherwise, ROS is as per the  HPI.   Objective:   BP 118/78 mmHg  Pulse 58  Temp(Src) 98.5 F (36.9 C) (Oral)  Wt 224 lb 8 oz (101.833 kg)  SpO2 97%   GEN: WDWN, NAD, Non-toxic, Alert & Oriented x 3 HEENT: Atraumatic, Normocephalic.  Ears and Nose: No external deformity. EXTR: No clubbing/cyanosis/edema NEURO: Normal gait.  PSYCH: Normally interactive. Conversant. Not depressed or anxious appearing.  Calm demeanor.    GU: Normal male. Normal testicular exam. Underneath the corona of the glans there is some signs of irritation of the scan and some whitish coloration and also some pinkish irritation.   Laboratory and Imaging Data:  Assessment and Plan:   Balanitis  Classic exam  Patient Instructions  CLOTRIMAZOLE cream. Apply to the area of penis underneath the head 2-3 times a day.   If this isn't working in 1 week, then call me, and I'll send in something else .     New Prescriptions   SILDENAFIL (REVATIO) 20 MG TABLET    Generic Revatio / Sildanefil 20 mg. 2 - 5 tabs 30 mins prior to intercourse. #50, 11 refills.   No orders of the defined types were placed in this encounter.    Signed,  Elpidio Galea. Mariyah Upshaw, MD   Patient's Medications  New Prescriptions   SILDENAFIL (REVATIO) 20 MG TABLET    Generic Revatio / Sildanefil 20 mg. 2 - 5 tabs 30 mins prior to intercourse. #50, 11 refills.  Previous Medications  No medications on file  Modified Medications   No medications on file  Discontinued Medications   SILDENAFIL (VIAGRA) 100 MG TABLET    1/2 to 1 tab po 30-60 minutes prior to sex as needed

## 2014-12-24 NOTE — Progress Notes (Signed)
Pre visit review using our clinic review tool, if applicable. No additional management support is needed unless otherwise documented below in the visit note. 

## 2014-12-24 NOTE — Patient Instructions (Signed)
CLOTRIMAZOLE cream. Apply to the area of penis underneath the head 2-3 times a day.   If this isn't working in 1 week, then call me, and I'll send in something else .

## 2014-12-31 ENCOUNTER — Telehealth: Payer: Self-pay

## 2014-12-31 MED ORDER — FLUCONAZOLE 150 MG PO TABS
150.0000 mg | ORAL_TABLET | Freq: Once | ORAL | Status: DC
Start: 2014-12-31 — End: 2015-01-14

## 2014-12-31 NOTE — Telephone Encounter (Signed)
Pt left v/m; pt seen 12/24/14 and clotrimazole cream not working; pt is no better and pt request another med sent to [facility]. Pt request cb.

## 2014-12-31 NOTE — Telephone Encounter (Signed)
Please call in:  Diflucan 150 mg, 1 po now. Repeat dose of 1 pill in 1 week., #2, 0 refills  Please let me know if not improving in 7-10 days

## 2014-12-31 NOTE — Telephone Encounter (Signed)
Mr. Holland notified as instructed by telephone.  Diflucan prescription sent into [facility].

## 2015-01-06 NOTE — Progress Notes (Signed)
This encounter was created in error - please disregard.

## 2015-01-07 ENCOUNTER — Encounter: Payer: Self-pay | Admitting: Podiatry

## 2015-01-07 ENCOUNTER — Ambulatory Visit (INDEPENDENT_AMBULATORY_CARE_PROVIDER_SITE_OTHER): Payer: Federal, State, Local not specified - PPO | Admitting: Podiatry

## 2015-01-07 VITALS — BP 129/80 | HR 71 | Resp 16

## 2015-01-07 DIAGNOSIS — Z9889 Other specified postprocedural states: Secondary | ICD-10-CM

## 2015-01-07 NOTE — Progress Notes (Signed)
He presents today for his final postop visit. He is status post Keller arthroplasty single silicone implant right foot. Date of surgery 08/22/2014. He states that he is doing very well with no pain.  Objective: Vital signs are stable he is alert and oriented 3. Pulses are palpable. No Pain. Limited range of motion of the first metatarsophalangeal joint better but is asymptomatic. Radiograph confirms well-placed Keller arthroplasty with single silicone implant.  Assessment: Well-healing surgical foot Limited range of motion first metatarsophalangeal joint.  Plan: I encouraged him to get a physical therapy and I wrote a prescription for this today he will call himself is on appointments I will follow up with him once he is complete with therapy.

## 2015-01-14 ENCOUNTER — Other Ambulatory Visit: Payer: Self-pay | Admitting: Family Medicine

## 2015-01-14 MED ORDER — FLUCONAZOLE 150 MG PO TABS: ORAL_TABLET | ORAL | Status: DC

## 2015-03-11 ENCOUNTER — Other Ambulatory Visit: Payer: Self-pay | Admitting: Family Medicine

## 2015-03-11 DIAGNOSIS — Z79899 Other long term (current) drug therapy: Secondary | ICD-10-CM

## 2015-03-11 DIAGNOSIS — Z125 Encounter for screening for malignant neoplasm of prostate: Secondary | ICD-10-CM

## 2015-03-11 DIAGNOSIS — E785 Hyperlipidemia, unspecified: Secondary | ICD-10-CM

## 2015-03-18 ENCOUNTER — Other Ambulatory Visit (INDEPENDENT_AMBULATORY_CARE_PROVIDER_SITE_OTHER): Payer: Federal, State, Local not specified - PPO

## 2015-03-18 ENCOUNTER — Other Ambulatory Visit: Payer: Federal, State, Local not specified - PPO

## 2015-03-18 DIAGNOSIS — Z125 Encounter for screening for malignant neoplasm of prostate: Secondary | ICD-10-CM

## 2015-03-18 DIAGNOSIS — E785 Hyperlipidemia, unspecified: Secondary | ICD-10-CM

## 2015-03-18 DIAGNOSIS — Z79899 Other long term (current) drug therapy: Secondary | ICD-10-CM | POA: Diagnosis not present

## 2015-03-18 LAB — HEPATIC FUNCTION PANEL
ALT: 15 U/L (ref 0–53)
AST: 16 U/L (ref 0–37)
Albumin: 3.9 g/dL (ref 3.5–5.2)
Alkaline Phosphatase: 44 U/L (ref 39–117)
Bilirubin, Direct: 0.1 mg/dL (ref 0.0–0.3)
Total Bilirubin: 0.7 mg/dL (ref 0.2–1.2)
Total Protein: 6.9 g/dL (ref 6.0–8.3)

## 2015-03-18 LAB — BASIC METABOLIC PANEL
BUN: 15 mg/dL (ref 6–23)
CO2: 29 mEq/L (ref 19–32)
Calcium: 9.4 mg/dL (ref 8.4–10.5)
Chloride: 104 mEq/L (ref 96–112)
Creatinine, Ser: 0.88 mg/dL (ref 0.40–1.50)
GFR: 116.78 mL/min (ref >60.00)
Glucose, Bld: 96 mg/dL (ref 70–99)
Potassium: 4 mEq/L (ref 3.5–5.1)
Sodium: 139 mEq/L (ref 135–145)

## 2015-03-18 LAB — LIPID PANEL
Cholesterol: 170 mg/dL (ref 0–200)
HDL: 46 mg/dL (ref >39.00)
LDL Cholesterol: 112 mg/dL — ABNORMAL HIGH (ref 0–99)
NonHDL: 124.33
Total CHOL/HDL Ratio: 4
Triglycerides: 61 mg/dL (ref 0.0–149.0)
VLDL: 12.2 mg/dL (ref 0.0–40.0)

## 2015-03-18 LAB — CBC WITH DIFFERENTIAL/PLATELET
Basophils Absolute: 0 10*3/uL (ref 0.0–0.1)
Basophils Relative: 0.4 % (ref 0.0–3.0)
Eosinophils Absolute: 0.1 10*3/uL (ref 0.0–0.7)
Eosinophils Relative: 1.4 % (ref 0.0–5.0)
HCT: 41.3 % (ref 39.0–52.0)
Hemoglobin: 13.5 g/dL (ref 13.0–17.0)
Lymphocytes Relative: 26.5 % (ref 12.0–46.0)
Lymphs Abs: 1.4 10*3/uL (ref 0.7–4.0)
MCHC: 32.6 g/dL (ref 30.0–36.0)
MCV: 97.1 fl (ref 78.0–100.0)
Monocytes Absolute: 0.5 10*3/uL (ref 0.1–1.0)
Monocytes Relative: 9 % (ref 3.0–12.0)
Neutro Abs: 3.4 10*3/uL (ref 1.4–7.7)
Neutrophils Relative %: 62.7 % (ref 43.0–77.0)
Platelets: 242 10*3/uL (ref 150.0–400.0)
RBC: 4.25 Mil/uL (ref 4.22–5.81)
RDW: 13.5 % (ref 11.5–15.5)
WBC: 5.4 10*3/uL (ref 4.0–10.5)

## 2015-03-18 LAB — PSA: PSA: 0.37 ng/mL (ref 0.10–4.00)

## 2015-03-25 ENCOUNTER — Encounter: Payer: Self-pay | Admitting: Family Medicine

## 2015-03-25 ENCOUNTER — Ambulatory Visit (INDEPENDENT_AMBULATORY_CARE_PROVIDER_SITE_OTHER): Payer: Federal, State, Local not specified - PPO | Admitting: Family Medicine

## 2015-03-25 VITALS — BP 140/80 | HR 70 | Temp 98.8°F | Ht 72.5 in | Wt 229.8 lb

## 2015-03-25 DIAGNOSIS — Z0000 Encounter for general adult medical examination without abnormal findings: Secondary | ICD-10-CM

## 2015-03-25 DIAGNOSIS — H6123 Impacted cerumen, bilateral: Secondary | ICD-10-CM

## 2015-03-25 DIAGNOSIS — L309 Dermatitis, unspecified: Secondary | ICD-10-CM | POA: Diagnosis not present

## 2015-03-25 MED ORDER — TRIAMCINOLONE ACETONIDE 0.1 % EX CREA: 1.0000 "application " | TOPICAL_CREAM | Freq: Two times a day (BID) | CUTANEOUS | Status: DC

## 2015-03-25 NOTE — Progress Notes (Signed)
Dr. Frederico Hamman T. Copland, MD, [facility] [place], [address] Phone: [phone] Fax: [phone]  03/25/2015  Patient: Darius Solis, MRN: [id number], DOB: January 28, 1961, 54 y.o.  Primary Physician:  [name], MD   Chief Complaint  Patient presents with  . Annual Exam   Subjective:   Darius Solis is a 54 y.o. pleasant patient who presents with the following:  Preventative Health Maintenance Visit:  Health Maintenance Summary Reviewed and updated, unless pt declines services.  Tobacco History Reviewed. Alcohol: No concerns, no excessive use Exercise Habits: Some activity, rec at least 30 mins 5 times a week STD concerns: no risk or activity to increase risk Drug Use: None Encouraged self-testicular check  Really busy on the Christmas rush.  [place] [employer] - working on the Music therapist.   Look in R ear. Wears earplugs, and will get wax buildup in his ears.  Eczema - has presented itself. Has used topical med from derm in the past that has helped.   Health Maintenance  Topic Date Due  . Hepatitis C Screening  [birth year]  . HIV Screening  11/01/1977  . INFLUENZA VACCINE  08/14/2015 (Originally 12/15/2014)  . TETANUS/TDAP  07/10/2019  . COLONOSCOPY  03/07/2024   Immunization History  Administered Date(s) Administered  . Influenza Split 03/02/2011  . Influenza,inj,Quad PF,36+ Mos 04/24/2013  . Td 07/09/2009   Patient Active Problem List   Diagnosis Date Noted  . FATIGUE 07/09/2009  . 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 Allergy    No past surgical history on file. Social History   Social History  . Marital Status: Married    Spouse Name: N/A  . Number of Children: N/A  . Years of Education: N/A   Occupational History  . Not on file.   Social History Main Topics  . Smoking status: Former Research scientist (life sciences)  . Smokeless tobacco: Never Used  . Alcohol Use: No   Comment: stopped 6503 was alcoholic  . 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 Sexual Activity: Not on file   Other Topics Concern  . Not on file   Social History Narrative   No family history on file. No Known Allergies  Medication list has been reviewed and updated.   General: Denies fever, chills, sweats. No significant weight loss. Eyes: Denies blurring,significant itching ENT: Denies earache, sore throat, and hoarseness. Cardiovascular: Denies chest pains, palpitations, dyspnea on exertion Respiratory: Denies cough, dyspnea at rest,wheeezing Breast: no concerns about lumps GI: Denies nausea, vomiting, diarrhea, constipation, change in bowel habits, abdominal pain, melena, hematochezia GU: Denies penile discharge, ED, urinary flow / outflow problems. No STD concerns. Musculoskeletal: Denies back pain, joint pain Derm: Denies rash, itching Neuro: Denies  paresthesias, frequent falls, frequent headaches Psych: Denies depression, anxiety Endocrine: Denies cold intolerance, heat intolerance, polydipsia Heme: Denies enlarged lymph nodes Allergy: No hayfever  Objective:   BP 140/80 mmHg  Pulse 70  Temp(Src) 98.8 F (37.1 C) (Oral)  Ht 6' 0.5" (1.842 m)  Wt 229 lb 12 oz (104.214 kg)  BMI 30.71 kg/m2 Ideal Body Weight: Weight in (lb) to have BMI = 25: 186.5  No exam data present  GEN: well developed, well nourished, no acute distress Eyes: conjunctiva and lids normal, PERRLA, EOMI ENT: TM clear, nares clear, oral exam WNL. Cerumen impaction b Neck: supple, no lymphadenopathy, no thyromegaly, no JVD Pulm:  clear to auscultation and percussion, respiratory effort normal CV: regular rate and rhythm, S1-S2, no murmur, rub or gallop, no bruits, peripheral pulses normal and symmetric, no cyanosis, clubbing, edema or varicosities GI: soft, non-tender; no hepatosplenomegaly, masses; active bowel sounds all quadrants GU: no hernia,  testicular mass, penile discharge Lymph: no cervical, axillary or inguinal adenopathy MSK: gait normal, muscle tone and strength WNL, no joint swelling, effusions, discoloration, crepitus  SKIN: clear, good turgor, color WNL, no rashes, lesions, or ulcerations Neuro: normal mental status, normal strength, sensation, and motion Psych: alert; oriented to person, place and time, normally interactive and not anxious or depressed in appearance. All labs reviewed with patient.  Lipids:    Component Value Date/Time   CHOL 170 03/18/2015 0917   TRIG 61.0 03/18/2015 0917   HDL 46.00 03/18/2015 0917   LDLDIRECT 130.2 03/02/2011 0939   VLDL 12.2 03/18/2015 0917   CHOLHDL 4 03/18/2015 0917   CBC: CBC Latest Ref Rng 03/18/2015 01/18/2013 03/02/2011  WBC 4.0 - 10.5 K/uL 5.4 5.0 4.1(L)  Hemoglobin 13.0 - 17.0 g/dL 13.5 13.4 13.6  Hematocrit 39.0 - 52.0 % 41.3 41.1 41.6  Platelets 150.0 - 400.0 K/uL 242.0 226.0 027.2    Basic Metabolic Panel:    Component Value Date/Time   NA 139 03/18/2015 0917   K 4.0 03/18/2015 0917   CL 104 03/18/2015 0917   CO2 29 03/18/2015 0917   BUN 15 03/18/2015 0917   CREATININE 0.88 03/18/2015 0917   GLUCOSE 96 03/18/2015 0917   CALCIUM 9.4 03/18/2015 0917   Hepatic Function Latest Ref Rng 03/18/2015 01/18/2013 03/02/2011  Total Protein 6.0 - 8.3 g/dL 6.9 6.9 7.3  Albumin 3.5 - 5.2 g/dL 3.9 3.8 4.2  AST 0 - 37 U/L _0 ALT 0 - 53 U/L _1 Alk Phosphatase 39 - 117 U/L 44 38(L) 43  Total Bilirubin 0.2 - 1.2 mg/dL 0.7 1.0 1.2  Bilirubin, Direct 0.0 - 0.3 mg/dL 0.1 0.1 0.1    Lab Results  Component Value Date   TSH 0.99 07/09/2009   Lab Results  Component Value Date   PSA 0.37 03/18/2015   PSA 0.26 01/18/2013   PSA 0.44 03/02/2011    Assessment and Plan:   Healthcare maintenance  Eczema: trial of TAC cream - unsure exactly what his derm used. If this does not help, then we could increase potency to something like clobetasole.  Cerumen  impaction, bilateral. Ceruminosis is noted.  Wax is removed by syringing and manual debridement. Instructions for home care to prevent wax buildup are given.   Health Maintenance Exam: The patient's preventative maintenance and recommended screening tests for an annual wellness exam were reviewed in full today. Brought up to date unless services declined.  Counselled on the importance of diet, exercise, and its role in overall health and mortality. The patient's FH and SH was reviewed, including their home life, tobacco status, and drug and alcohol status.  Follow-up: No Follow-up on file. Unless noted, follow-up in 1 year for Health Maintenance Exam.  New Prescriptions   TRIAMCINOLONE CREAM (KENALOG) 0.1 %    Apply 1 application topically 2 (two) times daily.   Modified Medications   No medications on file   No orders of the defined types were placed in this encounter.    Signed,  Maud Deed. Copland, MD   Patient's Medications  New Prescriptions   TRIAMCINOLONE CREAM (KENALOG) 0.1 %    Apply 1 application topically 2 (two) times  daily.  Previous Medications   SILDENAFIL (REVATIO) 20 MG TABLET    Generic Revatio / Sildanefil 20 mg. 2 - 5 tabs 30 mins prior to intercourse. #50, 11 refills.  Modified Medications   No medications on file  Discontinued Medications   FLUCONAZOLE (DIFLUCAN) 150 MG TABLET    1 tablet every other day for 14 days

## 2015-03-25 NOTE — Progress Notes (Signed)
Pre visit review using our clinic review tool, if applicable. No additional management support is needed unless otherwise documented below in the visit note. 

## 2015-03-30 ENCOUNTER — Encounter: Payer: Self-pay | Admitting: *Deleted

## 2015-03-30 ENCOUNTER — Encounter: Payer: Self-pay | Admitting: Internal Medicine

## 2015-03-30 ENCOUNTER — Ambulatory Visit (INDEPENDENT_AMBULATORY_CARE_PROVIDER_SITE_OTHER): Payer: Federal, State, Local not specified - PPO | Admitting: Internal Medicine

## 2015-03-30 VITALS — BP 120/80 | HR 73 | Temp 98.1°F | Wt 232.0 lb

## 2015-03-30 DIAGNOSIS — J028 Acute pharyngitis due to other specified organisms: Secondary | ICD-10-CM

## 2015-03-30 DIAGNOSIS — J029 Acute pharyngitis, unspecified: Secondary | ICD-10-CM | POA: Insufficient documentation

## 2015-03-30 LAB — POCT RAPID STREP A (OFFICE): Rapid Strep A Screen: NEGATIVE

## 2015-03-30 NOTE — Patient Instructions (Signed)
You can try aleve 2 twice a day till you are feeling better.

## 2015-03-30 NOTE — Progress Notes (Signed)
   Subjective:    Patient ID: Darius Solis, male    DOB: Mar 21, 1961, 54 y.o.   MRN: [id number]  HPI Here for respiratory symptoms Has had sore throat--noted it 3 days ago Voice is off last night  No cough No SOB Feels discomfort along left side of head to ear---congested No fever No chills or sweats  Uses salt water gargles Chloraseptic spray and ricola--not really helping  Current Outpatient Prescriptions on File Prior to Visit  Medication Sig Dispense Refill  . sildenafil (REVATIO) 20 MG tablet Generic Revatio / Sildanefil 20 mg. 2 - 5 tabs 30 mins prior to intercourse. #50, 11 refills. 50 tablet 11  . triamcinolone cream (KENALOG) 0.1 % Apply 1 application topically 2 (two) times daily. 454 g 1   No current facility-administered medications on file prior to visit.    No Known Allergies  Past Medical History  Diagnosis Date  . Allergy     No past surgical history on file.  No family history on file.  Social History   Social History  . Marital Status: Married    Spouse Name: N/A  . Number of Children: N/A  . Years of Education: N/A   Occupational History  . Not on file.   Social History Main Topics  . Smoking status: Former Games developermoker  . Smokeless tobacco: Never Used  . 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 Sexual Activity: Not on file   Other Topics Concern  . Not on file   Social History Narrative   Review of Systems No rash No vomiting or diarrhea Appetite is okay    Objective:   Physical Exam  Constitutional: He appears well-developed and well-nourished. No distress.  HENT:  Hoarseness noted Mild pharyngeal injection TMs normal Mild nasal inflammation  Neck: Normal range of motion. Neck supple. No thyromegaly present.  Pulmonary/Chest: Effort normal and breath sounds normal. No respiratory distress. He has no wheezes. He has no rales.    Lymphadenopathy:    He has no cervical adenopathy.          Assessment & Plan:

## 2015-03-30 NOTE — Assessment & Plan Note (Signed)
With laryngitis Clearly seems to be viral--- rapid strep also done and was negative Discussed supportive care---especially analgesics

## 2015-03-30 NOTE — Progress Notes (Signed)
Pre visit review using our clinic review tool, if applicable. No additional management support is needed unless otherwise documented below in the visit note. 

## 2015-04-23 ENCOUNTER — Telehealth: Payer: Self-pay | Admitting: Family Medicine

## 2015-08-05 ENCOUNTER — Encounter: Payer: Self-pay | Admitting: Internal Medicine

## 2015-08-05 ENCOUNTER — Ambulatory Visit (INDEPENDENT_AMBULATORY_CARE_PROVIDER_SITE_OTHER): Payer: Federal, State, Local not specified - PPO | Admitting: Internal Medicine

## 2015-08-05 VITALS — BP 120/82 | HR 84 | Temp 99.5°F | Wt 221.0 lb

## 2015-08-05 DIAGNOSIS — J069 Acute upper respiratory infection, unspecified: Secondary | ICD-10-CM

## 2015-08-05 MED ORDER — AZITHROMYCIN 250 MG PO TABS: ORAL_TABLET | ORAL | Status: DC

## 2015-08-05 MED ORDER — HYDROCODONE-HOMATROPINE 5-1.5 MG/5ML PO SYRP: 5.0000 mL | ORAL_SOLUTION | Freq: Three times a day (TID) | ORAL | Status: DC | PRN

## 2015-08-05 NOTE — Progress Notes (Signed)
Pre visit review using our clinic review tool, if applicable. No additional management support is needed unless otherwise documented below in the visit note. 

## 2015-08-05 NOTE — Patient Instructions (Signed)
Upper Respiratory Infection, Adult Most upper respiratory infections (URIs) are a viral infection of the air passages leading to the lungs. A URI affects the nose, throat, and upper air passages. The most common type of URI is nasopharyngitis and is typically referred to as "the common cold." URIs run their course and usually go away on their own. Most of the time, a URI does not require medical attention, but sometimes a bacterial infection in the upper airways can follow a viral infection. This is called a secondary infection. Sinus and middle ear infections are common types of secondary upper respiratory infections. Bacterial pneumonia can also complicate a URI. A URI can worsen asthma and chronic obstructive pulmonary disease (COPD). Sometimes, these complications can require emergency medical care and may be life threatening.  CAUSES Almost all URIs are caused by viruses. A virus is a type of germ and can spread from one person to another.  RISKS FACTORS You may be at risk for a URI if:   You smoke.   You have chronic heart or lung disease.  You have a weakened defense (immune) system.   You are very young or very old.   You have nasal allergies or asthma.  You work in crowded or poorly ventilated areas.  You work in health care facilities or schools. SIGNS AND SYMPTOMS  Symptoms typically develop 2-3 days after you come in contact with a cold virus. Most viral URIs last 7-10 days. However, viral URIs from the influenza virus (flu virus) can last 14-18 days and are typically more severe. Symptoms may include:   Runny or stuffy (congested) nose.   Sneezing.   Cough.   Sore throat.   Headache.   Fatigue.   Fever.   Loss of appetite.   Pain in your forehead, behind your eyes, and over your cheekbones (sinus pain).  Muscle aches.  DIAGNOSIS  Your health care provider may diagnose a URI by:  Physical exam.  Tests to check that your symptoms are not due to  another condition such as:  Strep throat.  Sinusitis.  Pneumonia.  Asthma. TREATMENT  A URI goes away on its own with time. It cannot be cured with medicines, but medicines may be prescribed or recommended to relieve symptoms. Medicines may help:  Reduce your fever.  Reduce your cough.  Relieve nasal congestion. HOME CARE INSTRUCTIONS   Take medicines only as directed by your health care provider.   Gargle warm saltwater or take cough drops to comfort your throat as directed by your health care provider.  Use a warm mist humidifier or inhale steam from a shower to increase air moisture. This may make it easier to breathe.  Drink enough fluid to keep your urine clear or pale yellow.   Eat soups and other clear broths and maintain good nutrition.   Rest as needed.   Return to work when your temperature has returned to normal or as your health care provider advises. You may need to stay home longer to avoid infecting others. You can also use a face mask and careful hand washing to prevent spread of the virus.  Increase the usage of your inhaler if you have asthma.   Do not use any tobacco products, including cigarettes, chewing tobacco, or electronic cigarettes. If you need help quitting, ask your health care provider. PREVENTION  The best way to protect yourself from getting a cold is to practice good hygiene.   Avoid oral or hand contact with people with cold   symptoms.   Wash your hands often if contact occurs.  There is no clear evidence that vitamin C, vitamin E, echinacea, or exercise reduces the chance of developing a cold. However, it is always recommended to get plenty of rest, exercise, and practice good nutrition.  SEEK MEDICAL CARE IF:   You are getting worse rather than better.   Your symptoms are not controlled by medicine.   You have chills.  You have worsening shortness of breath.  You have brown or red mucus.  You have yellow or brown nasal  discharge.  You have pain in your face, especially when you bend forward.  You have a fever.  You have swollen neck glands.  You have pain while swallowing.  You have white areas in the back of your throat. SEEK IMMEDIATE MEDICAL CARE IF:   You have severe or persistent:  Headache.  Ear pain.  Sinus pain.  Chest pain.  You have chronic lung disease and any of the following:  Wheezing.  Prolonged cough.  Coughing up blood.  A change in your usual mucus.  You have a stiff neck.  You have changes in your:  Vision.  Hearing.  Thinking.  Mood. MAKE SURE YOU:   Understand these instructions.  Will watch your condition.  Will get help right away if you are not doing well or get worse.   This information is not intended to replace advice given to you by your health care provider. Make sure you discuss any questions you have with your health care provider.   Document Released: 10/26/2000 Document Revised: 09/16/2014 Document Reviewed: 08/07/2013 Elsevier Interactive Patient Education 2016 [employer]  

## 2015-08-05 NOTE — Progress Notes (Signed)
HPI  Pt presents to the clinic today with c/o runny nose, sore throat, cough and chest congestion. This started 4-5 days ago. He is blowing green mucous out of his nose. The cough is productive of green mucous. He denies shortness of breath. He has run low grade fevers and had chills but denies body aches. He has tried Tylenol and Nyquil with minimal relief. He does have a history of seasonal allergies. He has had sick contacts diagnosed with pneumonia. He did not get his flu shot this year.  Review of Systems      Past Medical History  Diagnosis Date  . Allergy     No family history on file.  Social History   Social History  . Marital Status: Married    Spouse Name: N/A  . Number of Children: N/A  . Years of Education: N/A   Occupational History  . Not on file.   Social History Main Topics  . Smoking status: Former Games developermoker  . Smokeless tobacco: Never Used  . 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 Sexual Activity: Not on file   Other Topics Concern  . Not on file   Social History Narrative    No Known Allergies   Constitutional: Positive fatigue and fever. Denies headache, abrupt weight changes.  HEENT:  Positive runny nose, sore throat. Denies eye redness, eye pain, pressure behind the eyes, facial pain, nasal congestion, ear pain, ringing in the ears, wax buildup, or bloody nose. Respiratory: Positive cough. Denies difficulty breathing or shortness of breath.  Cardiovascular: Denies chest pain, chest tightness, palpitations or swelling in the hands or feet.   No other specific complaints in a complete review of systems (except as listed in HPI above).  Objective:   BP 120/82 mmHg  Pulse 84  Temp(Src) 99.5 F (37.5 C) (Oral)  Wt 221 lb (100.245 kg)  SpO2 98% Wt Readings from Last 3 Encounters:  08/05/15 221 lb (100.245 kg)  03/30/15 232 lb (105.235 kg)   03/25/15 229 lb 12 oz (104.214 kg)     General: Appears his stated age, in NAD. HEENT: Head: normal shape and size, no sinus tenderness; Eyes: sclera white, no icterus, conjunctiva pink; Ears: bilateral cerumen impaction; Nose: mucosa pink and moist, septum midline; Throat/Mouth: Teeth present, mucosa erythematous and moist, no exudate noted, no lesions or ulcerations noted.  Neck: Cervical lymphadenopathy noted bilaterally.  Cardiovascular: Normal rate and rhythm. S1,S2 noted.  No murmur, rubs or gallops noted.  Pulmonary/Chest: Normal effort and positive vesicular breath sounds. No respiratory distress. No wheezes, rales or ronchi noted.      Assessment & Plan:   Upper Respiratory Infection:  Get some rest and drink plenty of water Do salt water gargles for the sore throat eRx for Azithromax x 5 days Rx for Hycodan cough syrup  RTC as needed or if symptoms persist.

## 2015-08-28 ENCOUNTER — Other Ambulatory Visit: Payer: Self-pay | Admitting: Physician Assistant

## 2015-08-28 DIAGNOSIS — M2392 Unspecified internal derangement of left knee: Secondary | ICD-10-CM

## 2015-09-23 ENCOUNTER — Ambulatory Visit
Admission: RE | Admit: 2015-09-23 | Discharge: 2015-09-23 | Disposition: A | Discharge status: Home / Self Care | Payer: Federal, State, Local not specified - PPO | Source: Ambulatory Visit | Attending: Physician Assistant | Admitting: Physician Assistant

## 2015-09-23 DIAGNOSIS — S83232A Complex tear of medial meniscus, current injury, left knee, initial encounter: Secondary | ICD-10-CM | POA: Insufficient documentation

## 2015-09-23 DIAGNOSIS — M2392 Unspecified internal derangement of left knee: Secondary | ICD-10-CM

## 2015-09-23 DIAGNOSIS — M949 Disorder of cartilage, unspecified: Secondary | ICD-10-CM | POA: Diagnosis not present

## 2015-09-23 DIAGNOSIS — S83262A Peripheral tear of lateral meniscus, current injury, left knee, initial encounter: Secondary | ICD-10-CM | POA: Insufficient documentation

## 2015-09-29 ENCOUNTER — Telehealth: Payer: Self-pay | Admitting: *Deleted

## 2015-09-29 NOTE — Telephone Encounter (Signed)
Patient came in office and stated that he got a MRI done and he was wondering if Dr. Patsy Lageropland could recommend any surgeons. [facility] it  was [facility] by Dr[facility] Van ClinesJon Wolfe [facility]. Patient is requesting a call back.  Call back [phone]

## 2015-09-30 NOTE — Telephone Encounter (Signed)
Mr. Darius Solis notified as instructed by telephone.

## 2015-09-30 NOTE — Telephone Encounter (Signed)
Van ClinesJon Wolfe usually works with Dr. Ernest PineHooten. He, Dr. Rosita KeaMenz, and Dr. Joice LoftsPoggi who work in their group all do a good job and are very nice.

## 2015-12-09 ENCOUNTER — Ambulatory Visit (INDEPENDENT_AMBULATORY_CARE_PROVIDER_SITE_OTHER): Payer: Federal, State, Local not specified - PPO | Admitting: Family Medicine

## 2015-12-09 VITALS — BP 108/80 | HR 71 | Temp 98.7°F | Ht 72.5 in | Wt 226.0 lb

## 2015-12-09 DIAGNOSIS — J0100 Acute maxillary sinusitis, unspecified: Secondary | ICD-10-CM

## 2015-12-09 DIAGNOSIS — S83206A Unspecified tear of unspecified meniscus, current injury, right knee, initial encounter: Secondary | ICD-10-CM | POA: Diagnosis not present

## 2015-12-09 MED ORDER — AMOXICILLIN-POT CLAVULANATE 875-125 MG PO TABS: 1.0000 | ORAL_TABLET | Freq: Two times a day (BID) | ORAL | 0 refills | Status: AC

## 2015-12-09 NOTE — Progress Notes (Signed)
Pre visit review using our clinic review tool, if applicable. No additional management support is needed unless otherwise documented below in the visit note. 

## 2015-12-09 NOTE — Progress Notes (Signed)
Dr. Karleen Hampshire T. Anandi Abramo, MD, CAQ Sports Medicine Primary Care and Sports Medicine [address] [place], [address] Phone: [phone] Fax: [phone]  12/09/2015  Patient: Darius Solis, MRN: [id number], DOB: 1960-11-10, 55 y.o.  Primary Physician:  Hannah Beat, MD   Chief Complaint  Patient presents with  . Sinus Drainage    x 4 weeks  . Cough    Dry   Subjective:   This 55 y.o. male patient presents with runny nose, sneezing, cough, sore throat, malaise and minimal / low-grade fever for > 3 week. Now the primary complaint has become sinus pressure and pain behind the eyes and in the upper, anterior face.   Sinus drainage and cough x 4 weeks. Draining mucous down throat. Getting some pain around his face, cough is stronger.   R knee, meniscal tear. Complex.   The patent denies sore throat as the primary complaint. Denies sthortness of breath/wheezing, high fever, chest pain, significant myalgia, otalgia, abdominal pain, changes in bowel or bladder.  PMH, PHS, Allergies, Problem List, Medications, Family History, and Social History have all been reviewed.  Patient Active Problem List   Diagnosis Date Noted  . 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 Allergy     No past surgical history on file.  Social History   Social History  . Marital status: Married    Spouse name: N/A  . Number of children: N/A  . Years of education: N/A   Occupational History  . Not on file.   Social History Main Topics  . Smoking status: Former Games developer  . Smokeless tobacco: Never Used  . 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 Sexual activity: Not on file   Other Topics Concern  . Not on file   Social History Narrative  . No narrative on file    No family history on file.  No Known Allergies  Medication list reviewed and  updated in full in [facility].  ROS as above, eating and drinking - tolerating PO. Urinating normally. No excessive vomitting or diarrhea. O/w as above.  Objective:   Blood pressure 108/80, pulse 71, temperature 98.7 F (37.1 C), temperature source Oral, height 6' 0.5" (1.842 m), weight 226 lb (102.5 kg).  GEN: WDWN, Non-toxic, Atraumatic, normocephalic. A and O x 3. HEENT: Oropharynx clear without exudate, MMM, no significant LAD, mild rhinnorhea Sinuses: Right Frontal, ethmoid, and maxillary: Tender Left Frontal, Ethmoid, and maxillary: Tender Ears: TM clear, COL visualized with good landmarks CV: RRR, no m/g/r. Pulm: CTA B, no wheezes, rhonchi, or crackles, normal respiratory effort. EXT: no c/c/e Psych: well oriented, neither depressed nor anxious in appearance  Assessment and Plan:   Acute maxillary sinusitis, recurrence not specified  Acute meniscal tear of right knee, initial encounter  Acute sinusitis: ABX as below.   Reviewed symptomatic care as well as ABX in this case.   An additional 13 minutes was spent in discussion regarding the patient's complex meniscal tear of his right knee including osteoarthritic changes. Reviewed with the patient what this meant, and also we reviewed potential options. He previously has seen another provider in a different practice, and I also made some recommendations for potential surgeons for this. He would like to wait at least 6 months before proceeding with anything.  Follow-up: No Follow-up on  file.  New Prescriptions   AMOXICILLIN-CLAVULANATE (AUGMENTIN) 875-125 MG TABLET    Take 1 tablet by mouth 2 (two) times daily.   Signed,  Elpidio Galea. Raylene Carmickle, MD  Patient's Medications  New Prescriptions   AMOXICILLIN-CLAVULANATE (AUGMENTIN) 875-125 MG TABLET    Take 1 tablet by mouth 2 (two) times daily.  Previous Medications   TRIAMCINOLONE OINTMENT (KENALOG) 0.1 %    Apply 1 application topically 2 (two) times daily.  Modified  Medications   No medications on file  Discontinued Medications   AZITHROMYCIN (ZITHROMAX) 250 MG TABLET    Take 2 tabs today, then 1 tab daily x 4 days   HYDROCODONE-HOMATROPINE (HYCODAN) 5-1.5 MG/5ML SYRUP    Take 5 mLs by mouth every 8 (eight) hours as needed for cough.

## 2016-02-18 DIAGNOSIS — S83232D Complex tear of medial meniscus, current injury, left knee, subsequent encounter: Secondary | ICD-10-CM | POA: Diagnosis not present

## 2016-02-18 DIAGNOSIS — M25562 Pain in left knee: Secondary | ICD-10-CM | POA: Diagnosis not present

## 2016-02-20 ENCOUNTER — Other Ambulatory Visit: Payer: Self-pay | Admitting: Family Medicine

## 2016-04-27 ENCOUNTER — Encounter: Payer: Self-pay | Admitting: Family Medicine

## 2016-04-27 ENCOUNTER — Ambulatory Visit (INDEPENDENT_AMBULATORY_CARE_PROVIDER_SITE_OTHER): Payer: Federal, State, Local not specified - PPO | Admitting: Family Medicine

## 2016-04-27 VITALS — BP 110/80 | HR 67 | Temp 98.4°F | Ht 72.5 in | Wt 229.0 lb

## 2016-04-27 DIAGNOSIS — B3742 Candidal balanitis: Secondary | ICD-10-CM | POA: Diagnosis not present

## 2016-04-27 DIAGNOSIS — Z23 Encounter for immunization: Secondary | ICD-10-CM | POA: Diagnosis not present

## 2016-04-27 MED ORDER — FLUCONAZOLE 150 MG PO TABS: ORAL_TABLET | ORAL | 0 refills | Status: DC

## 2016-04-27 NOTE — Progress Notes (Signed)
Pre visit review using our clinic review tool, if applicable. No additional management support is needed unless otherwise documented below in the visit note. 

## 2016-04-27 NOTE — Progress Notes (Signed)
Dr. Afia Messenger T. Ardella Chhim, MD, CAQ Sports Medicine Primary Care and Sports Medicine [address] [place], [id number] Phone: [phone] Fax: [phone]  04/27/2016  Patient: Darius Solis, MRN: [id number], DOB: June 10, 1960, 55 y.o.  Primary Physician:  Hannah BeatSpencer Jhayden Demuro, MD   Chief Complaint  Patient presents with  . Rash    Genital Area   Subjective:   Darius Peachernell Boak is a 55 y.o. very pleasant male patient who presents with the following:  Balanitis, He indicates this bout 1218 months ago, and was treated this topically, and he has had a wrist occurrences of this over the last few weeks. Is some irritation and itching with low bit of some burning.  Past Medical History, Surgical History, Social History, Family History, Problem List, Medications, and Allergies have been reviewed and updated if relevant.  Patient Active Problem List   Diagnosis Date Noted  . 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 Allergy     No past surgical history on file.  Social History   Social History  . Marital status: Married    Spouse name: N/A  . Number of children: N/A  . Years of education: N/A   Occupational History  . Not on file.   Social History Main Topics  . Smoking status: Former Games developermoker  . Smokeless tobacco: Never Used  . 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 Sexual activity: Not on file   Other Topics Concern  . Not on file   Social History Narrative  . No narrative on file    No family history on file.  No Known Allergies  Medication list reviewed and updated in full in [facility].   GEN: No acute illnesses, no fevers, chills. GI: No n/v/d, eating normally Pulm: No SOB Interactive and getting along well at home.  Otherwise, ROS is as per the HPI.  Objective:   BP 110/80   Pulse 67   Temp 98.4 F (36.9  C) (Oral)   Ht 6' 0.5" (1.842 m)   Wt 229 lb (103.9 kg)   BMI 30.63 kg/m   GEN: WDWN, NAD, Non-toxic, A & O x 3 HEENT: Atraumatic, Normocephalic. Neck supple. No masses, No LAD. Ears and Nose: No external deformity. GU: irritation at the distal shaft before the head of the penis EXTR: No c/c/e NEURO Normal gait.  PSYCH: Normally interactive. Conversant. Not depressed or anxious appearing.  Calm demeanor.   Laboratory and Imaging Data:  Assessment and Plan:   Candidal balanitis  Need for prophylactic vaccination and inoculation against influenza - Plan: Flu Vaccine QUAD 36+ mos IM  Cont clotrimazole  Follow-up: No Follow-up on file.  Meds ordered this encounter  Medications  . fluconazole (DIFLUCAN) 150 MG tablet    Sig: 1 tablet po weekly for 3 weeks    Dispense:  3 tablet    Refill:  0   Medications Discontinued During This Encounter  Medication Reason  . triamcinolone ointment (KENALOG) 0.1 % Completed Course   Orders Placed This Encounter  Procedures  . Flu Vaccine QUAD 36+ mos IM    Signed,  Welden Hausmann T. Talen Poser, MD     Medication List       Accurate as of 04/27/16  1:47 PM. Always use your most recent med list.  fluconazole 150 MG tablet Commonly known as:  DIFLUCAN 1 tablet po weekly for 3 weeks   sildenafil 20 MG tablet Commonly known as:  REVATIO TAKE 2-5 TABLETS BY MOUTH 30 MINUTES PRIOR TO INTERCOURSE

## 2016-05-18 DIAGNOSIS — L400 Psoriasis vulgaris: Secondary | ICD-10-CM | POA: Diagnosis not present

## 2016-06-09 DIAGNOSIS — K080 Exfoliation of teeth due to systemic causes: Secondary | ICD-10-CM | POA: Diagnosis not present

## 2016-06-16 DIAGNOSIS — M1712 Unilateral primary osteoarthritis, left knee: Secondary | ICD-10-CM | POA: Diagnosis not present

## 2016-12-15 ENCOUNTER — Other Ambulatory Visit: Payer: Self-pay | Admitting: Family Medicine

## 2016-12-15 MED ORDER — FLUCONAZOLE 150 MG PO TABS: ORAL_TABLET | ORAL | 0 refills | Status: DC

## 2016-12-15 NOTE — Progress Notes (Signed)
balanitis

## 2016-12-21 ENCOUNTER — Ambulatory Visit: Payer: Federal, State, Local not specified - PPO | Admitting: Family Medicine

## 2016-12-29 DIAGNOSIS — K080 Exfoliation of teeth due to systemic causes: Secondary | ICD-10-CM | POA: Diagnosis not present

## 2017-04-13 ENCOUNTER — Other Ambulatory Visit: Payer: Self-pay | Admitting: Family Medicine

## 2017-07-13 DIAGNOSIS — K080 Exfoliation of teeth due to systemic causes: Secondary | ICD-10-CM | POA: Diagnosis not present

## 2017-12-20 ENCOUNTER — Ambulatory Visit: Payer: Federal, State, Local not specified - PPO | Admitting: Family Medicine

## 2017-12-20 ENCOUNTER — Encounter: Payer: Self-pay | Admitting: Family Medicine

## 2017-12-20 VITALS — BP 120/74 | HR 57 | Temp 98.5°F | Ht 72.25 in | Wt 218.5 lb

## 2017-12-20 DIAGNOSIS — Z1159 Encounter for screening for other viral diseases: Secondary | ICD-10-CM

## 2017-12-20 DIAGNOSIS — Z114 Encounter for screening for human immunodeficiency virus [HIV]: Secondary | ICD-10-CM | POA: Diagnosis not present

## 2017-12-20 DIAGNOSIS — Z0000 Encounter for general adult medical examination without abnormal findings: Secondary | ICD-10-CM | POA: Diagnosis not present

## 2017-12-20 MED ORDER — SILDENAFIL CITRATE 20 MG PO TABS: ORAL_TABLET | ORAL | 11 refills | Status: DC

## 2017-12-20 NOTE — Progress Notes (Signed)
Dr. Frederico Hamman T. Agnieszka Newhouse, MD, [facility] Primary Care and Sports Medicine [place], [address] Phone: [phone] Fax: [phone]  12/20/2017  Patient: Darius Solis, MRN: [id number], DOB: 08-28-1960, 57 y.o.  Primary Physician:  [name], MD   Chief Complaint  Patient presents with  . Back Pain    lower back  . Medication Refill    Sildenafil   Subjective:   Darius Solis is a 57 y.o. pleasant patient who presents with the following:  Preventative Health Maintenance Visit:  Health Maintenance Summary Reviewed and updated, unless pt declines services.  Tobacco History Reviewed. Alcohol: No concerns, no excessive use Exercise Habits: working out most days now, [employer] and intense classes STD concerns: no risk or activity to increase risk Drug Use: None Encouraged self-testicular check  He did have some back pain after his recent cruise, and this is all but in the last week since he got home.  He is not having any numbness or tingling, but is mostly having some left-sided erector spinae complex discomfort.  Health Maintenance  Topic Date Due  . Hepatitis C Screening  12/17/1960  . HIV Screening  11/01/1977  . INFLUENZA VACCINE  12/14/2017  . TETANUS/TDAP  07/10/2019  . COLONOSCOPY  03/07/2024   Immunization History  Administered Date(s) Administered  . Influenza Split 03/02/2011  . Influenza,inj,Quad PF,6+ Mos 04/24/2013, 04/27/2016, 01/24/2017  . Td 07/09/2009   Patient Active Problem List   Diagnosis Date Noted  . 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 Allergy    History reviewed. No pertinent surgical history. Social History   Socioeconomic History  . Marital status: Married    Spouse name: Not on file  . Number of children: Not on file  . Years of education: Not on file  . Highest education level: Not on file  Occupational History  . Not on file  Social Needs  . Financial  resource strain: Not on file  . Food insecurity:    Worry: Not on file    Inability: Not on file  . Transportation needs:    Medical: Not on file    Non-medical: Not on file  Tobacco Use  . Smoking status: Former Research scientist (life sciences)  . Smokeless tobacco: Never Used  Substance and Sexual Activity  . 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 Sexual activity: Not on file  Lifestyle  . Physical activity:    Days per week: Not on file    Minutes per session: Not on file  . Stress: Not on file  Relationships  . Social connections:    Talks on phone: Not on file    Gets together: Not on file    Attends religious service: Not on file    Active member of club or organization: Not on file    Attends meetings of clubs or organizations: Not on file    Relationship status: Not on file  . Intimate partner violence:    Fear of current or ex partner: Not on file    Emotionally abused: Not on file    Physically abused: Not on file    Forced sexual activity: Not on file  Other Topics Concern  . Not on file  Social History Narrative  . Not on file   History reviewed. No pertinent family history. No Known  Allergies  Medication list has been reviewed and updated.   General: Denies fever, chills, sweats. No significant weight loss. Eyes: Denies blurring,significant itching ENT: Denies earache, sore throat, and hoarseness. Cardiovascular: Denies chest pains, palpitations, dyspnea on exertion Respiratory: Denies cough, dyspnea at rest,wheeezing Breast: no concerns about lumps GI: Denies nausea, vomiting, diarrhea, constipation, change in bowel habits, abdominal pain, melena, hematochezia GU: Denies penile discharge, ED, urinary flow / outflow problems. No STD concerns. Musculoskeletal: back pain as above Derm: Denies rash, itching Neuro: Denies  paresthesias, frequent falls, frequent headaches Psych:  Denies depression, anxiety Endocrine: Denies cold intolerance, heat intolerance, polydipsia Heme: Denies enlarged lymph nodes Allergy: No hayfever  Objective:   BP 120/74   Pulse (!) 57   Temp 98.5 F (36.9 C) (Oral)   Ht 6' 0.25" (1.835 m)   Wt 218 lb 8 oz (99.1 kg)   BMI 29.43 kg/m  Ideal Body Weight: Weight in (lb) to have BMI = 25: 185.2  No exam data present  GEN: well developed, well nourished, no acute distress Eyes: conjunctiva and lids normal, PERRLA, EOMI ENT: TM clear, nares clear, oral exam WNL Neck: supple, no lymphadenopathy, no thyromegaly, no JVD Pulm: clear to auscultation and percussion, respiratory effort normal CV: regular rate and rhythm, S1-S2, no murmur, rub or gallop, no bruits, peripheral pulses normal and symmetric, no cyanosis, clubbing, edema or varicosities GI: soft, non-tender; no hepatosplenomegaly, masses; active bowel sounds all quadrants GU: no hernia, testicular mass, penile discharge Lymph: no cervical, axillary or inguinal adenopathy MSK: gait normal, muscle tone and strength WNL, no joint swelling, effusions, discoloration, crepitus   Range of motion at  the waist: Flexion: normal Extension: normal Lateral bending: normal Rotation: all normal  No echymosis or edema Rises to examination table with no difficulty Gait: non antalgic  Inspection/Deformity: N Paraspinus Tenderness: L l3-s1  B Ankle Dorsiflexion (L5,4): 5/5 B Great Toe Dorsiflexion (L5,4): 5/5 Heel Walk (L5): WNL Toe Walk (S1): WNL Rise/Squat (L4): WNL  SENSORY B Medial Foot (L4): WNL B Dorsum (L5): WNL B Lateral (S1): WNL Light Touch: WNL Pinprick: WNL  REFLEXES Knee (L4): 2+ Ankle (S1): 2+  B SLR, seated: neg B SLR, supine: neg B FABER: neg B Reverse FABER: neg B Greater Troch: NT B Log Roll: neg B Stork: NT B Sciatic Notch: NT   SKIN: clear, good turgor, color WNL, no rashes, lesions, or ulcerations Neuro: normal mental status, normal strength,  sensation, and motion Psych: alert; oriented to person, place and time, normally interactive and not anxious or depressed in appearance.  All labs reviewed with patient.  Lipids:    Component Value Date/Time   CHOL 170 03/18/2015 0917   TRIG 61.0 03/18/2015 0917   HDL 46.00 03/18/2015 0917   LDLDIRECT 130.2 03/02/2011 0939   VLDL 12.2 03/18/2015 0917   CHOLHDL 4 03/18/2015 0917   CBC: CBC Latest Ref Rng & Units 03/18/2015 01/18/2013 03/02/2011  WBC 4.0 - 10.5 K/uL 5.4 5.0 4.1(L)  Hemoglobin 13.0 - 17.0 g/dL 13.5 13.4 13.6  Hematocrit 39.0 - 52.0 % 41.3 41.1 41.6  Platelets 150.0 - 400.0 K/uL 242.0 226.0 419.6    Basic Metabolic Panel:    Component Value Date/Time   NA 139 03/18/2015 0917   K 4.0 03/18/2015 0917   CL 104 03/18/2015 0917   CO2 29 03/18/2015 0917   BUN 15 03/18/2015 0917   CREATININE 0.88 03/18/2015 0917   GLUCOSE 96 03/18/2015 0917   CALCIUM 9.4 03/18/2015 0917   Hepatic  Function Latest Ref Rng & Units 03/18/2015 01/18/2013 03/02/2011  Total Protein 6.0 - 8.3 g/dL 6.9 6.9 7.3  Albumin 3.5 - 5.2 g/dL 3.9 3.8 4.2  AST 0 - 37 U/L '16 25 23  '$ ALT 0 - 53 U/L '15 29 19  '$ Alk Phosphatase 39 - 117 U/L 44 38(L) 43  Total Bilirubin 0.2 - 1.2 mg/dL 0.7 1.0 1.2  Bilirubin, Direct 0.0 - 0.3 mg/dL 0.1 0.1 0.1    Lab Results  Component Value Date   TSH 0.99 07/09/2009   Lab Results  Component Value Date   PSA 0.37 03/18/2015   PSA 0.26 01/18/2013   PSA 0.44 03/02/2011    Assessment and Plan:   Healthcare maintenance - Plan: Basic metabolic panel, CBC with Differential/Platelet, Hepatic function panel, Lipid panel, PSA  Screening for HIV (human immunodeficiency virus) - Plan: HIV antibody  Need for hepatitis C screening test - Plan: Hepatitis C antibody  Doing well Reassured about back  Health Maintenance Exam: The patient's preventative maintenance and recommended screening tests for an annual wellness exam were reviewed in full today. Brought up to date  unless services declined.  Counselled on the importance of diet, exercise, and its role in overall health and mortality. The patient's FH and SH was reviewed, including their home life, tobacco status, and drug and alcohol status.  Follow-up in 1 year for physical exam or additional follow-up below.  Follow-up: No follow-ups on file. Or follow-up in 1 year if not noted.  Meds ordered this encounter  Medications  . sildenafil (REVATIO) 20 MG tablet    Sig: TAKE 2-5 TABLETS BY MOUTH 30 MINUTES PRIOR TO INTERCOURSE    Dispense:  50 tablet    Refill:  11   Medications Discontinued During This Encounter  Medication Reason  . fluconazole (DIFLUCAN) 150 MG tablet Completed Course  . sildenafil (REVATIO) 20 MG tablet Reorder   Orders Placed This Encounter  Procedures  . Basic metabolic panel  . CBC with Differential/Platelet  . Hepatic function panel  . Lipid panel  . PSA  . Hepatitis C antibody  . HIV antibody    Signed,  Frederico Hamman T. Ohn Bostic, MD   Allergies as of 12/20/2017   No Known Allergies     Medication List        Accurate as of 12/20/17 11:59 PM. Always use your most recent med list.          sildenafil 20 MG tablet Commonly known as:  REVATIO TAKE 2-5 TABLETS BY MOUTH 30 MINUTES PRIOR TO INTERCOURSE

## 2017-12-21 LAB — CBC WITH DIFFERENTIAL/PLATELET
Basophils Absolute: 0.1 10*3/uL (ref 0.0–0.1)
Basophils Relative: 1.2 % (ref 0.0–3.0)
Eosinophils Absolute: 0.1 10*3/uL (ref 0.0–0.7)
Eosinophils Relative: 1.4 % (ref 0.0–5.0)
HCT: 41.8 % (ref 39.0–52.0)
Hemoglobin: 14 g/dL (ref 13.0–17.0)
Lymphocytes Relative: 29.7 % (ref 12.0–46.0)
Lymphs Abs: 1.5 10*3/uL (ref 0.7–4.0)
MCHC: 33.5 g/dL (ref 30.0–36.0)
MCV: 97.8 fl (ref 78.0–100.0)
Monocytes Absolute: 0.4 10*3/uL (ref 0.1–1.0)
Monocytes Relative: 8.2 % (ref 3.0–12.0)
Neutro Abs: 3.1 10*3/uL (ref 1.4–7.7)
Neutrophils Relative %: 59.5 % (ref 43.0–77.0)
Platelets: 265 10*3/uL (ref 150.0–400.0)
RBC: 4.28 Mil/uL (ref 4.22–5.81)
RDW: 12.8 % (ref 11.5–15.5)
WBC: 5.2 10*3/uL (ref 4.0–10.5)

## 2017-12-21 LAB — HIV ANTIBODY (ROUTINE TESTING W REFLEX): HIV 1&2 Ab, 4th Generation: NONREACTIVE

## 2017-12-21 LAB — HEPATIC FUNCTION PANEL
ALT: 27 U/L (ref 0–53)
AST: 19 U/L (ref 0–37)
Albumin: 4.4 g/dL (ref 3.5–5.2)
Alkaline Phosphatase: 51 U/L (ref 39–117)
Bilirubin, Direct: 0.1 mg/dL (ref 0.0–0.3)
Total Bilirubin: 0.7 mg/dL (ref 0.2–1.2)
Total Protein: 7.6 g/dL (ref 6.0–8.3)

## 2017-12-21 LAB — LIPID PANEL
Cholesterol: 201 mg/dL — ABNORMAL HIGH (ref 0–200)
HDL: 52.7 mg/dL (ref >39.00)
LDL Cholesterol: 130 mg/dL — ABNORMAL HIGH (ref 0–99)
NonHDL: 148.16
Total CHOL/HDL Ratio: 4
Triglycerides: 93 mg/dL (ref 0.0–149.0)
VLDL: 18.6 mg/dL (ref 0.0–40.0)

## 2017-12-21 LAB — BASIC METABOLIC PANEL
BUN: 17 mg/dL (ref 6–23)
CO2: 32 mEq/L (ref 19–32)
Calcium: 10.1 mg/dL (ref 8.4–10.5)
Chloride: 102 mEq/L (ref 96–112)
Creatinine, Ser: 1.07 mg/dL (ref 0.40–1.50)
GFR: 92.23 mL/min (ref >60.00)
Glucose, Bld: 83 mg/dL (ref 70–99)
Potassium: 4.5 mEq/L (ref 3.5–5.1)
Sodium: 139 mEq/L (ref 135–145)

## 2017-12-21 LAB — HEPATITIS C ANTIBODY
Hepatitis C Ab: NONREACTIVE
SIGNAL TO CUT-OFF: 0.13 (ref <1.00)

## 2017-12-21 LAB — PSA: PSA: 0.36 ng/mL (ref 0.10–4.00)

## 2017-12-22 ENCOUNTER — Encounter: Payer: Self-pay | Admitting: *Deleted

## 2018-01-18 DIAGNOSIS — K080 Exfoliation of teeth due to systemic causes: Secondary | ICD-10-CM | POA: Diagnosis not present

## 2018-01-25 DIAGNOSIS — K080 Exfoliation of teeth due to systemic causes: Secondary | ICD-10-CM | POA: Diagnosis not present

## 2018-08-15 ENCOUNTER — Ambulatory Visit (INDEPENDENT_AMBULATORY_CARE_PROVIDER_SITE_OTHER): Admitting: Family Medicine

## 2018-08-15 ENCOUNTER — Other Ambulatory Visit: Payer: Self-pay

## 2018-08-15 ENCOUNTER — Encounter: Payer: Self-pay | Admitting: Family Medicine

## 2018-08-15 VITALS — BP 110/72 | HR 54 | Temp 98.4°F | Ht 72.25 in | Wt 224.5 lb

## 2018-08-15 DIAGNOSIS — R51 Headache: Secondary | ICD-10-CM | POA: Diagnosis not present

## 2018-08-15 DIAGNOSIS — S0181XA Laceration without foreign body of other part of head, initial encounter: Secondary | ICD-10-CM | POA: Diagnosis not present

## 2018-08-15 DIAGNOSIS — S0993XA Unspecified injury of face, initial encounter: Secondary | ICD-10-CM

## 2018-08-15 DIAGNOSIS — R519 Headache, unspecified: Secondary | ICD-10-CM

## 2018-08-15 NOTE — Progress Notes (Signed)
Undra Trembath T. Floyde Dingley, MD Primary Care and Sports Medicine [facility] [address] [place], [address] Phone: [phone]  FAX: [phone]  Adithya Alman - 58 y.o. male  MRN [id number]  Date of Birth: 1961-04-09  Visit Date: 08/15/2018  PCP: Hannah Beat, MD  Referred by: Hannah Beat, MD  Chief Complaint  Patient presents with  . Cut on Right Side of face    done at work yesterday   Subjective:   Darius Solis is a 58 y.o. very pleasant male patient who presents with the following:  Cut on the R side of his face. Td is up to date.   DOI: 08/14/2018.  Facial trauma at work and got a cut at that point.  This was an injury at work, and he was struck in the face above his right eye and the eyebrow region, and he did have some swelling, he does have evidence of a small laceration.  There is some scabbing on it at this point.  He is not having any significant pain around the bones in his face, and he is not having any double vision or blurred vision.  Immunization History  Administered Date(s) Administered  . Influenza Split 03/02/2011  . Influenza,inj,Quad PF,6+ Mos 04/24/2013, 04/27/2016, 01/24/2017  . Td 07/09/2009     Past Medical History, Surgical History, Social History, Family History, Problem List, Medications, and Allergies have been reviewed and updated if relevant.  Patient Active Problem List   Diagnosis Date Noted  . 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 Allergy     History reviewed. No pertinent surgical history.  Social History   Socioeconomic History  . Marital status: Married    Spouse name: Not on file  . Number of children: Not on file  . Years of education: Not on file  . Highest education level: Not on file  Occupational History  . Not on file  Social Needs  . Financial resource strain: Not on file  . Food insecurity:    Worry: Not on file   Inability: Not on file  . Transportation needs:    Medical: Not on file    Non-medical: Not on file  Tobacco Use  . Smoking status: Former Games developer  . Smokeless tobacco: Never Used  Substance and Sexual Activity  . 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 Sexual activity: Not on file  Lifestyle  . Physical activity:    Days per week: Not on file    Minutes per session: Not on file  . Stress: Not on file  Relationships  . Social connections:    Talks on phone: Not on file    Gets together: Not on file    Attends religious service: Not on file    Active member of club or organization: Not on file    Attends meetings of clubs or organizations: Not on file    Relationship status: Not on file  . Intimate partner violence:    Fear of current or ex partner: Not on file    Emotionally abused: Not on file    Physically abused: Not on file    Forced sexual activity: Not on file  Other Topics Concern  . Not on file  Social History Narrative  . Not on file  History reviewed. No pertinent family history.  No Known Allergies  Medication list reviewed and updated in full in [facility].  GEN: No fevers, chills. Nontoxic. Primarily MSK c/o today. MSK: Detailed in the HPI GI: tolerating PO intake without difficulty Neuro: No numbness, parasthesias, or tingling associated. Otherwise, the pertinent positives and negatives are listed above and in the HPI, otherwise a full review of systems has been reviewed and is negative unless noted positive.   Objective:   BP 110/72   Pulse (!) 54   Temp 98.4 F (36.9 C) (Oral)   Ht 6' 0.25" (1.835 m)   Wt 224 lb 8 oz (101.8 kg)   BMI 30.24 kg/m    GEN: WDWN, NAD, Non-toxic, Alert & Oriented x 3 HEENT: Atraumatic, Normocephalic.  Ears and Nose: No external deformity. EXTR: No clubbing/cyanosis/edema NEURO: Normal gait.  PSYCH:  Normally interactive. Conversant. Not depressed or anxious appearing.  Calm demeanor.    The patient's neck moves in all directions without difficulty.  Flexion, extension, lateral bending as well as rotational movements.  Bilaterally, pupils are equal round reactive and light and accommodation.  Extraocular movements are intact.  Visual fields are intact.  There is no significant tenderness on all of the facial bones as well as the nasal bones.  He does have some mild swelling around the area above his eye in the supraorbital region.  There is a small laceration, less than 1 cm that is well approximated at this point and has some scab formation.  Radiology: No results found.  Assessment and Plan:   Facial laceration, initial encounter  Facial pain  Facial trauma, initial encounter  No clinical signs of fracture, and movements are completely fine and visual fields are fine.  He does have a minor laceration that is less than a centimeter, and its edges are well approximated now, and I do not think it needs any stitches or anything else, and it is also 24 hours after this injury.  Tetanus is up-to-date.  I gave him some reassurance, and I think that this will heal perfectly well and he is fine to resume work on Friday.  He is given me some Worker's Comp. information, and we will feel this out for him.  Follow-up: prn only  Signed,  Jorita Bohanon T. Noah Lembke, MD

## 2018-08-16 ENCOUNTER — Telehealth: Payer: Self-pay | Admitting: Family Medicine

## 2018-08-16 NOTE — Telephone Encounter (Signed)
workmans comp paperwork in dr copland in box For review and signature

## 2018-08-22 DIAGNOSIS — Z0279 Encounter for issue of other medical certificate: Secondary | ICD-10-CM

## 2018-08-22 NOTE — Telephone Encounter (Signed)
Pt aware paperwork ready for pick up  He will turn in  Copy for pt Copy for scan Copy for billing

## 2018-08-22 NOTE — Telephone Encounter (Signed)
done

## 2019-01-08 NOTE — Progress Notes (Signed)
     Delyle Weider T. Haile Toppins, MD Primary Care and [place] at [facility] [place], [address] Phone: [phone]  FAX: [phone]  Darius Solis - 58 y.o. male  MRN [id number]  Date of Birth: 04-18-1961  Visit Date: 01/09/2019  PCP: [name], MD  Referred by: [name], MD  Chief Complaint  Patient presents with  . Anxiety    Son passed away on Sep 25, 2022   Subjective:   Darius Solis is a 58 y.o. very pleasant male patient who presents with the following:  Son passed away, woods x 17 days  >15 minutes spent in face to face time with patient, >50% spent in counselling or coordination of care: The patient was quite upset, and he recently found out over the weekend that his son had passed away.  He actually had been dead for 17 days prior to then find in the body outside of [place].  This was his oldest child.  He actually had another child died 47 years ago.  He has been very upset since of finding out this news.  He is having some acute anxiety as well.    ICD-10-CM   1. 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  Meds ordered this encounter  Medications  . diazepam (VALIUM) 2 MG tablet    Sig: Take 1 tablet (2 mg total) by mouth every 8 (eight) hours as needed for anxiety.    Dispense:  30 tablet    Refill:  0  . sildenafil (REVATIO) 20 MG tablet    Sig: TAKE 2-5 TABLETS BY MOUTH 30 MINUTES PRIOR TO INTERCOURSE    Dispense:  50 tablet    Refill:  11   No orders of the defined types were placed in this encounter.   Signed,  Maud Deed. Alylah Blakney, MD   Outpatient Encounter Medications as of 01/09/2019  Medication Sig  . diazepam (VALIUM) 2 MG tablet Take 1 tablet (2 mg total) by mouth every 8 (eight) hours as needed for anxiety.  . sildenafil (REVATIO) 20 MG tablet TAKE 2-5 TABLETS BY MOUTH 30 MINUTES PRIOR TO INTERCOURSE  . [DISCONTINUED] sildenafil (REVATIO) 20 MG tablet  TAKE 2-5 TABLETS BY MOUTH 30 MINUTES PRIOR TO INTERCOURSE   No facility-administered encounter medications on file as of 01/09/2019.

## 2019-01-09 ENCOUNTER — Other Ambulatory Visit: Payer: Self-pay

## 2019-01-09 ENCOUNTER — Ambulatory Visit: Payer: Federal, State, Local not specified - PPO | Admitting: Family Medicine

## 2019-01-09 ENCOUNTER — Encounter: Payer: Self-pay | Admitting: Family Medicine

## 2019-01-09 VITALS — BP 118/78 | HR 70 | Temp 98.6°F | Ht 72.25 in | Wt 229.5 lb

## 2019-01-09 DIAGNOSIS — F4321 Adjustment disorder with depressed mood: Secondary | ICD-10-CM | POA: Diagnosis not present

## 2019-01-09 MED ORDER — SILDENAFIL CITRATE 20 MG PO TABS: ORAL_TABLET | ORAL | 11 refills | Status: DC

## 2019-01-09 MED ORDER — DIAZEPAM 2 MG PO TABS: 2.0000 mg | ORAL_TABLET | Freq: Three times a day (TID) | ORAL | 0 refills | Status: DC | PRN

## 2019-02-01 ENCOUNTER — Ambulatory Visit (INDEPENDENT_AMBULATORY_CARE_PROVIDER_SITE_OTHER): Payer: Federal, State, Local not specified - PPO | Admitting: Family Medicine

## 2019-02-01 ENCOUNTER — Encounter: Payer: Self-pay | Admitting: Family Medicine

## 2019-02-01 ENCOUNTER — Other Ambulatory Visit: Payer: Self-pay | Admitting: *Deleted

## 2019-02-01 VITALS — Ht 72.25 in | Wt 229.0 lb

## 2019-02-01 DIAGNOSIS — R05 Cough: Secondary | ICD-10-CM

## 2019-02-01 DIAGNOSIS — R059 Cough, unspecified: Secondary | ICD-10-CM | POA: Insufficient documentation

## 2019-02-01 DIAGNOSIS — Z20822 Contact with and (suspected) exposure to covid-19: Secondary | ICD-10-CM

## 2019-02-01 DIAGNOSIS — R6889 Other general symptoms and signs: Secondary | ICD-10-CM | POA: Diagnosis not present

## 2019-02-01 MED ORDER — GUAIFENESIN-CODEINE 100-10 MG/5ML PO SYRP: 5.0000 mL | ORAL_SOLUTION | Freq: Every evening | ORAL | 0 refills | Status: DC | PRN

## 2019-02-01 NOTE — Assessment & Plan Note (Signed)
Sent for COVID testing. Home isolation info and self care info provided.  Treat with muscine DM during the day and prescription cough suppressant sent  For night.

## 2019-02-01 NOTE — Progress Notes (Signed)
VIRTUAL VISIT Due to national recommendations of social distancing due to [place] 19, a virtual visit is felt to be most appropriate for this patient at this time.   I connected with the patient on 02/01/19 at  9:40 AM EDT by virtual telehealth platform and verified that I am speaking with the correct person using two identifiers.   Interactive audio and video telecommunications were attempted between this provider and patient, however failed, due to patient having technical difficulties OR patient did not have access to video capability.  We continued and completed visit with audio only. .  I discussed the limitations, risks, security and privacy concerns of performing an evaluation and management service by  virtual telehealth platform and the availability of in person appointments. I also discussed with the patient that there may be a patient responsible charge related to this service. The patient expressed understanding and agreed to proceed.  Patient location: Home Provider Location: [place] Participants: Darius Solis and Sarina Ser   Chief Complaint  Patient presents with  . Cough    started Monday  . Nasal Congestion  . Sore Throat    History of Present Illness:   58 year old male pt of Dr. Loletha Grayer previously healthy presents with 5 day history of cough, dry,  nasal congestion and sore throat. Subjective fever.. no thermometer available. No chills.  No SOB, no wheezing. No chest pain.  Cough keeping him up at night.  Using nyquil, dayquil, mucinex.. helps some.  No chronic respiratory issues.  He has 2 COVID19 compilation risk factors: age and sex.  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  The importance of social distancing was discussed today.   Review of Systems  Constitutional: Negative for chills and fever.  HENT: Positive for congestion and sore throat. Negative for ear pain and sinus pain.   Eyes: Negative for pain and redness.   Respiratory: Positive for cough. Negative for sputum production, shortness of breath and wheezing.   Cardiovascular: Negative for chest pain, palpitations and leg swelling.  Gastrointestinal: Negative for abdominal pain, blood in stool, constipation, diarrhea, nausea and vomiting.  Genitourinary: Negative for dysuria.  Musculoskeletal: Negative for falls and myalgias.  Skin: Negative for rash.  Neurological: Negative for dizziness.  Psychiatric/Behavioral: Negative for depression. The patient is not nervous/anxious.       Past Medical History:  Diagnosis Date  . Allergy     reports that he has quit smoking. He has never used smokeless tobacco. He reports that he does not drink alcohol or use drugs.   Current Outpatient Medications:  .  diazepam (VALIUM) 2 MG tablet, Take 1 tablet (2 mg total) by mouth every 8 (eight) hours as needed for anxiety., Disp: 30 tablet, Rfl: 0 .  sildenafil (REVATIO) 20 MG tablet, TAKE 2-5 TABLETS BY MOUTH 30 MINUTES PRIOR TO INTERCOURSE, Disp: 50 tablet, Rfl: 11   Observations/Objective: Height 6' 0.25" (1.835 m), weight 229 lb (103.9 kg).  Physical Exam  Physical Exam Constitutional:      General: The patient is not in acute distress. Pulmonary:     Effort: Pulmonary effort is normal. No respiratory distress.  Neurological:     Mental Status: The patient is alert and oriented to person, place, and time.  Psychiatric:        Mood and Affect: Mood normal.        Behavior: Behavior normal.   Assessment and Plan Cough Sent for COVID testing. Home isolation info and  self care info provided.  Treat with muscine DM during the day and prescription cough suppressant sent  For night.      I discussed the assessment and treatment plan with the patient. The patient was provided an opportunity to ask questions and all were answered. The patient agreed with the plan and demonstrated an understanding of the instructions.   The patient was advised to call  back or seek an in-person evaluation if the symptoms worsen or if the condition fails to improve as anticipated.   Total visit time 10 minutes, > 50% spent counseling and cordinating patients care.   Kerby NoraAmy Bedsole, MD

## 2019-02-01 NOTE — Patient Instructions (Signed)
Darius Solis.aebco How to care for yourself at home:   1) Drink plenty of fluids 2) Get lots of rest 3) Wash your hands regularly - for 20 seconds 4) Cover your mouth when you cough -- ideally cough into your elbow 5) Take tylenol - can do up to 1000 mg every 8 hours (or do smaller doses more frequently) - Avoid Ibuprofen if able 6) Stay home - avoid contact with other people. Ideally have someone bring your groceries, etc.  7) Avoid touching your eyes, nose, mouth 8) Over the counter cold medication may be helpful   If your COVID test is POSITIVE you may return to work/school  if all of the following are true: 1.  10 days since symptom onset or positive COVID-19 test 2.    3 consecutive days without fever and without antipyretics 3.    You have symptom improvement [especially respiratory]).  If your COVID test is NEGATIVE you may return to work when you are 24 hour fever free and respiratory symptoms are resolved.   Call the clinic immediately or consider going to the emergency room if:  1) Trouble Breathing 2) Persistent pain or pressure in the chest 3) New confusion or inability to wake 4) Bluish lips or face 5) Notify them that you may have COVID-19  For close contacts (people in your home)  1) Help the person you are with stay home - grocery, pharmacy, etc 2) Help monitor their symptoms for worsening illness 3) Ideally sleep in a separate room and try to use separate bathroom if possible 4) Try to limit care giver to ONE person - all others (including pets) should avoid contact 5) Clean surfaces often and wash laundry often 6) Monitor yourself for symptoms. Make sure you contact your health care provider and avoid work as soon as you develop symptoms 7) Ideally would recommend working from home or not going to work if able.    Medications:  You may have heard about medications currently being used to treat COVID-19 - including Remdesivir and Hydroxychloroquine/Chloroquine. Currently  there are no FDA approved medications to treat COVID-19 and these medicines are only being used as part of a clinical trials (still studying the effect). They are only being used in hospitalized patients because they come with significant risks.   The only proven treatment is symptomatic care - rest, fluids, tylenol.    Below is more detailed information from the [facility]  Individuals who are confirmed to have, or are being evaluated for, COVID-19 should follow the prevention steps below until a healthcare provider or local or state health department says they can return to normal activities.  Stay home except to get medical care You should restrict activities outside your home, except for getting medical care. Do not go to work, school, or public areas, and do not use public transportation or taxis.  Call ahead before visiting your doctor Before your medical appointment, call the healthcare provider and tell them that you are being evaluated for, COVID-19 infection.  Monitor your symptoms Seek prompt medical attention if your illness is worsening (e.g., difficulty breathing).   Wear a facemask You should wear a facemask that covers your nose and mouth when you are in the same room with other people and when you visit a healthcare provider.   Separate yourself from other people in your home As much as possible, you should stay in a different room from other people in your home. Also, you should use a separate  bathroom, if available.  Avoid sharing household items You should not share dishes, drinking glasses, cups, eating utensils, towels, bedding, or other items with other people in your home. After using these items, you should wash them thoroughly with soap and water.  Cover your coughs and sneezes Cover your mouth and nose with a tissue when you cough or sneeze, or you can cough or sneeze into your sleeve. Throw used tissues in a lined trash can, and immediately  wash your hands with soap and water for at least 20 seconds or use an alcohol-based hand rub.  Wash your [employer] your hands often and thoroughly with soap and water for at least 20 seconds. You can use an alcohol-based hand sanitizer if soap and water are not available and if your hands are not visibly dirty. Avoid touching your eyes, nose, and mouth with unwashed hands.   Prevention Steps for Caregivers and Household Members of Individuals Confirmed to have, or Being Evaluated for, COVID-19 Infection Being Cared for in the Home  If you live with, or provide care at home for, a person confirmed to have, or being evaluated for, COVID-19 infection please follow these guidelines to prevent infection:  Follow healthcare provider's instructions Make sure that you understand and can help the patient follow any healthcare provider instructions for all care.  Provide for the patient's basic needs You should help the patient with basic needs in the home and provide support for getting groceries, prescriptions, and other personal needs.  Monitor the patient's symptoms If they are getting sicker, call his or her medical provider.   Limit the number of people who have contact with the patient  If possible, have only one caregiver for the patient.  Other household members should stay in another home or place of residence. If this is not possible, they should stay  in another room, or be separated from the patient as much as possible. Use a separate bathroom, if available.  Restrict visitors who do not have an essential need to be in the home.  Keep older adults, very young children, and other sick people away from the patient Keep older adults, very young children, and those who have compromised immune systems or chronic health conditions away from the patient. This includes people with chronic heart, lung, or kidney conditions, diabetes, and cancer.  Ensure good ventilation Make sure  that shared spaces in the home have good air flow, such as from an air conditioner or an opened window, weather permitting.  Wash your hands often  Wash your hands often and thoroughly with soap and water for at least 20 seconds. You can use an alcohol based hand sanitizer if soap and water are not available and if your hands are not visibly dirty.  Avoid touching your eyes, nose, and mouth with unwashed hands.  Use disposable paper towels to dry your hands. If not available, use dedicated cloth towels and replace them when they become wet.  Wear a facemask and gloves  Wear a disposable facemask at all times in the room and gloves when you touch or have contact with the patient's blood, body fluids, and/or secretions or excretions, such as sweat, saliva, sputum, nasal mucus, vomit, urine, or feces.  Ensure the mask fits over your nose and mouth tightly, and do not touch it during use.  Throw out disposable facemasks and gloves after using them. Do not reuse.  Wash your hands immediately after removing your facemask and gloves.  If your personal  clothing becomes contaminated, carefully remove clothing and launder. Wash your hands after handling contaminated clothing.  Place all used disposable facemasks, gloves, and other waste in a lined container before disposing them with other household waste.  Remove gloves and wash your hands immediately after handling these items.  Do not share dishes, glasses, or other household items with the patient  Avoid sharing household items. You should not share dishes, drinking glasses, cups, eating utensils, towels, bedding, or other items with a patient who is confirmed to have, or being evaluated for, COVID-19 infection.  After the person uses these items, you should wash them thoroughly with soap and water.  Wash laundry thoroughly  Immediately remove and wash clothes or bedding that have blood, body fluids, and/or secretions or excretions, such  as sweat, saliva, sputum, nasal mucus, vomit, urine, or feces, on them.  Wear gloves when handling laundry from the patient.  Read and follow directions on labels of laundry or clothing items and detergent. In general, wash and dry with the warmest temperatures recommended on the label.  Clean all areas the individual has used often  Clean all touchable surfaces, such as counters, tabletops, doorknobs, bathroom fixtures, toilets, phones, keyboards, tablets, and bedside tables, every day. Also, clean any surfaces that may have blood, body fluids, and/or secretions or excretions on them.  Wear gloves when cleaning surfaces the patient has come in contact with.  Use a diluted bleach solution (e.g., dilute bleach with 1 part bleach and 10 parts water) or a household disinfectant with a label that says EPA-registered for coronaviruses. To make a bleach solution at home, add 1 tablespoon of bleach to 1 quart (4 cups) of water. For a larger supply, add  cup of bleach to 1 gallon (16 cups) of water.  Read labels of cleaning products and follow recommendations provided on product labels. Labels contain instructions for safe and effective use of the cleaning product including precautions you should take when applying the product, such as wearing gloves or eye protection and making sure you have good ventilation during use of the product.  Remove gloves and wash hands immediately after cleaning.  Monitor yourself for signs and symptoms of illness Caregivers and household members are considered close contacts, should monitor their health, and will be asked to limit movement outside of the home to the extent possible. Follow the monitoring steps for close contacts listed on the symptom monitoring form.

## 2019-02-02 LAB — NOVEL CORONAVIRUS, NAA: SARS-CoV-2, NAA: NOT DETECTED

## 2019-02-14 ENCOUNTER — Telehealth: Payer: Self-pay | Admitting: Family Medicine

## 2019-02-14 NOTE — Telephone Encounter (Signed)
FMLA paperwork in dr copland's in box  For review and signature

## 2019-02-22 NOTE — Telephone Encounter (Signed)
Paperwork faxed ° °Pt aware °Copy for pt °Copy for scan °

## 2019-02-28 ENCOUNTER — Telehealth: Payer: Self-pay | Admitting: Family Medicine

## 2019-03-04 NOTE — Telephone Encounter (Signed)
Patient called in regards to to his FMLA  He is requesting it to be extended until Dec.

## 2019-03-04 NOTE — Telephone Encounter (Signed)
I will have to get another copy

## 2019-03-05 NOTE — Telephone Encounter (Signed)
Pt is aware of dr copland's comment He stated he needed be off this weekend and fmla paperwork end on 10/26 he thought

## 2019-03-06 NOTE — Telephone Encounter (Signed)
FMLA paperwork in dr copland in box  Pt wanted intermittent from 03/12/2019 to 05/17/2019  He stated he would like 2 x month to take all weekend  He works weekends and has 2 days off during week

## 2019-03-07 NOTE — Telephone Encounter (Signed)
done

## 2019-03-07 NOTE — Telephone Encounter (Signed)
Paperwork faxed °

## 2019-03-11 NOTE — Telephone Encounter (Signed)
Pt aware  °Copy for pt °Copy for scan °

## 2019-03-14 DIAGNOSIS — Z1159 Encounter for screening for other viral diseases: Secondary | ICD-10-CM | POA: Diagnosis not present

## 2019-05-01 ENCOUNTER — Other Ambulatory Visit: Payer: Self-pay

## 2019-05-01 ENCOUNTER — Encounter: Payer: Self-pay | Admitting: Family Medicine

## 2019-05-01 ENCOUNTER — Ambulatory Visit: Payer: Federal, State, Local not specified - PPO | Admitting: Family Medicine

## 2019-05-01 VITALS — BP 120/80 | HR 66 | Temp 98.0°F | Ht 72.25 in | Wt 233.5 lb

## 2019-05-01 DIAGNOSIS — M549 Dorsalgia, unspecified: Secondary | ICD-10-CM | POA: Diagnosis not present

## 2019-05-01 DIAGNOSIS — M533 Sacrococcygeal disorders, not elsewhere classified: Secondary | ICD-10-CM | POA: Diagnosis not present

## 2019-05-01 MED ORDER — CYCLOBENZAPRINE HCL 10 MG PO TABS: 5.0000 mg | ORAL_TABLET | Freq: Every evening | ORAL | 1 refills | Status: DC | PRN

## 2019-05-01 MED ORDER — PREDNISONE 20 MG PO TABS: ORAL_TABLET | ORAL | 0 refills | Status: DC

## 2019-05-01 NOTE — Progress Notes (Signed)
Farron Watrous T. Khrystyne Arpin, MD Primary Care and Sports Medicine [facility] at [facility] [address] [place], [address] Phone: [phone]  FAX: [phone]  Darius Solis - 58 y.o. male  MRN [id number]  Date of Birth: [birth year]  Visit Date: 05/01/2019  PCP: Hannah Beat, MD  Referred by: Hannah Beat, MD  Chief Complaint  Patient presents with  . Back Pain    x 3 weeks    This visit occurred during the SARS-CoV-2 public health emergency.  Safety protocols were in place, including screening questions prior to the visit, additional usage of staff PPE, and extensive cleaning of exam room while observing appropriate contact time as indicated for disinfecting solutions.   Subjective:   Darius Solis is a 58 y.o. very pleasant male patient with Body mass index is 31.45 kg/m. 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  Strain on whole body.  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  He is coping okay with the loss of his son earlier in the year.  Primarily has pain in the lowest part of his lumbar spine as well as in the caudal aspect of the sacral region.  He was having to lift heavy boxes of every day.  He is does not describe any numbness or tingling.  He has no loss of balance or change in his strength.  No significant prior back history or surgeries.  Getting a lot of notes from other people.   Around the tailbone and hurts a lot. Feel like a knot and in the one area.  No numbness or thing and having a lot of stiffness. Heating pad on it.   R si joint  Past Medical History, Surgical History, Social History, Family History, Problem List, Medications, and Allergies have been reviewed and updated if relevant.  Patient Active Problem List   Diagnosis Date Noted  . Cough 02/01/2019  . 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 Allergy     History reviewed. No pertinent  surgical history.  Social History   Socioeconomic History  . Marital status: Married    Spouse name: Not on file  . Number of children: Not on file  . Years of education: Not on file  . Highest education level: Not on file  Occupational History  . Not on file  Tobacco Use  . Smoking status: Former Games developer  . Smokeless tobacco: Never Used  Substance and Sexual Activity  . 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 Sexual activity: Not on file  Other Topics Concern  . Not on file  Social History Narrative  . Not on file   Social Determinants of Health   Financial Resource Strain:   . Difficulty of Paying Living Expenses: Not on file  Food Insecurity:   . Worried About Programme researcher, broadcasting/film/video in the Last Year: Not on file  . Ran Out of Food in the Last Year: Not on file  Transportation Needs:   . Lack of Transportation (Medical): Not on file  . Lack of Transportation (Non-Medical): Not on file  Physical Activity:   . Days of Exercise per Week: Not on file  . Minutes of Exercise per Session: Not on file  Stress:   . Feeling of Stress : Not  on file  Social Connections:   . Frequency of Communication with Friends and Family: Not on file  . Frequency of Social Gatherings with Friends and Family: Not on file  . Attends Religious Services: Not on file  . Active Member of Clubs or Organizations: Not on file  . Attends BankerClub or Organization Meetings: Not on file  . Marital Status: Not on file  Intimate Partner Violence:   . Fear of Current or Ex-Partner: Not on file  . Emotionally Abused: Not on file  . Physically Abused: Not on file  . Sexually Abused: Not on file    History reviewed. No pertinent family history.  No Known Allergies  Medication list reviewed and updated in full in [facility].  GEN: No fevers, chills. Nontoxic. Primarily MSK c/o today. MSK: Detailed in  the HPI GI: tolerating PO intake without difficulty Neuro: No numbness, parasthesias, or tingling associated. Otherwise the pertinent positives of the ROS are noted above.   Objective:   BP 120/80   Pulse 66   Temp 98 F (36.7 C) (Temporal)   Ht 6' 0.25" (1.835 m)   Wt 233 lb 8 oz (105.9 kg)   SpO2 99%   BMI 31.45 kg/m    GEN: Well-developed,well-nourished,in no acute distress; alert,appropriate and cooperative throughout examination HEENT: Normocephalic and atraumatic without obvious abnormalities. Ears, externally no deformities PULM: Breathing comfortably in no respiratory distress EXT: No clubbing, cyanosis, or edema PSYCH: Normally interactive. Cooperative during the interview. Pleasant. Friendly and conversant. Not anxious or depressed appearing. Normal, full affect.  Range of motion at  the waist: Flexion, extension, lateral bending and rotation: Extension is limited secondary to pain.  Forward flexion is normal.  Lateral bending is approximately 25% poor compared to the be expected for age.  No echymosis or edema Rises to examination table with mild difficulty Gait: minimally antalgic  Inspection/Deformity: N Paraspinus Tenderness: Diffuse tenderness from L4-S1 the greatest tenderness at the right SI joint.  B Ankle Dorsiflexion (L5,4): 5/5 B Great Toe Dorsiflexion (L5,4): 5/5 Heel Walk (L5): WNL Toe Walk (S1): WNL Rise/Squat (L4): WNL, mild pain  SENSORY B Medial Foot (L4): WNL B Dorsum (L5): WNL B Lateral (S1): WNL Light Touch: WNL Pinprick: WNL  REFLEXES Knee (L4): 2+ Ankle (S1): 2+  B SLR, seated: neg B SLR, supine: neg B FABER: neg B Reverse FABER: neg B Greater Troch: NT B Log Roll: neg B Stork: NT B Sciatic Notch: There to palpation on the right  Radiology: No results found.  Assessment and Plan:     ICD-10-CM   1. Acute back pain, unspecified back location, unspecified back pain laterality  M54.9   2. SI (sacroiliac) joint dysfunction   M53.3    Back pain with some focal back pain to the right-sided SI joint.  Palpation.  I am in a give the patient 10 days of steroids, Flexeril at nighttime.  I reviewed some home rehab with him.  Muscle going to have him stay out of work until Monday.  Ideally he will be significantly better, but there will be continued challenges in the 70-hour work week.  Follow-up: No follow-ups on file.  Meds ordered this encounter  Medications  . predniSONE (DELTASONE) 20 MG tablet    Sig: 2 tabs po daily for 5 days, then 1 tab po daily for 5 days    Dispense:  15 tablet    Refill:  0  . cyclobenzaprine (FLEXERIL) 10 MG tablet    Sig: Take  0.5-1 tablets (5-10 mg total) by mouth at bedtime as needed for muscle spasms.    Dispense:  30 tablet    Refill:  1   No orders of the defined types were placed in this encounter.   Signed,  Maud Deed. Charita Lindenberger, MD   Outpatient Encounter Medications as of 05/01/2019  Medication Sig  . sildenafil (REVATIO) 20 MG tablet TAKE 2-5 TABLETS BY MOUTH 30 MINUTES PRIOR TO INTERCOURSE  . cyclobenzaprine (FLEXERIL) 10 MG tablet Take 0.5-1 tablets (5-10 mg total) by mouth at bedtime as needed for muscle spasms.  . diazepam (VALIUM) 2 MG tablet Take 1 tablet (2 mg total) by mouth every 8 (eight) hours as needed for anxiety. (Patient not taking: Reported on 05/01/2019)  . predniSONE (DELTASONE) 20 MG tablet 2 tabs po daily for 5 days, then 1 tab po daily for 5 days  . [DISCONTINUED] guaiFENesin-codeine (ROBITUSSIN AC) 100-10 MG/5ML syrup Take 5-10 mLs by mouth at bedtime as needed for cough.   No facility-administered encounter medications on file as of 05/01/2019.

## 2019-05-21 NOTE — Progress Notes (Signed)
Darius Marcos T. Kemond Amorin, MD Primary Care and Sports Medicine [facility] at [facility] [address] [place], [address] Phone: [phone]  FAX: [phone]  Darius Solis - 59 y.o. male  MRN [id number]  Date of Birth: 1960-11-21  Visit Date: 05/22/2019  PCP: Hannah Beat, MD  Referred by: Hannah Beat, MD  Chief Complaint  Patient presents with  . Follow-up    Low Back Pain   This visit occurred during the SARS-CoV-2 public health emergency.  Safety protocols were in place, including screening questions prior to the visit, additional usage of staff PPE, and extensive cleaning of exam room while observing appropriate contact time as indicated for disinfecting solutions.   Subjective:   Darius Solis is a 59 y.o. very pleasant male patient with Body mass index is 31.01 kg/m. who presents with the following:  He is a well-known patient he comes in with some back pain, follow-up.  I saw him on May 01, 2019.  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  At the time of my previous evaluation I placed him on some Flexeril and gave him a 10-day course of some prednisone.  He returns with some ongoing persistent symptoms for evaluation.  6 weeks in total. Tingling with squatting. Muscle rel helped.  He is still having a lot of problems after his son died last year.  He thinks that he is worsened since the increased work demands for Christmas have decreased.  His wife is doing poorly and crying much of the time.  He also has not had blood work in some time and wanted to get all of his routine labs checked today.  Increased FMLA, son died.   Past Medical History, Surgical History, Social History, Family History, Problem List, Medications, and Allergies have been reviewed and updated if relevant.  Patient Active Problem List   Diagnosis Date Noted  . 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  Allergy     History reviewed. No pertinent surgical history.  Social History   Socioeconomic History  . Marital status: Married    Spouse name: Not on file  . Number of children: Not on file  . Years of education: Not on file  . Highest education level: Not on file  Occupational History  . Not on file  Tobacco Use  . Smoking status: Former Games developer  . Smokeless tobacco: Never Used  Substance and Sexual Activity  . 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 Sexual activity: Not on file  Other Topics Concern  . Not on file  Social History Narrative  . Not on file   Social Determinants of Health   Financial Resource Strain:   . Difficulty of Paying Living Expenses: Not on file  Food Insecurity:   . Worried About Programme researcher, broadcasting/film/video in the Last Year: Not on file  . Ran Out of Food in the Last Year: Not on file  Transportation Needs:   . Lack of Transportation (Medical): Not on file  . Lack of Transportation (Non-Medical): Not on file  Physical Activity:   . Days of Exercise per Week: Not on file  . Minutes of Exercise per Session: Not on file  Stress:   . Feeling of Stress : Not on file  Social Connections:   .  Frequency of Communication with Friends and Family: Not on file  . Frequency of Social Gatherings with Friends and Family: Not on file  . Attends Religious Services: Not on file  . Active Member of Clubs or Organizations: Not on file  . Attends Banker Meetings: Not on file  . Marital Status: Not on file  Intimate Partner Violence:   . Fear of Current or Ex-Partner: Not on file  . Emotionally Abused: Not on file  . Physically Abused: Not on file  . Sexually Abused: Not on file    History reviewed. No pertinent family history.  No Known Allergies  Medication list reviewed and updated in full in [facility].  GEN: No fevers, chills. Nontoxic.  Primarily MSK c/o today. MSK: Detailed in the HPI GI: tolerating PO intake without difficulty Neuro: No numbness, parasthesias, or tingling associated. Otherwise the pertinent positives of the ROS are noted above.   Objective:   BP 110/70   Pulse 70   Temp 97.8 F (36.6 C) (Temporal)   Ht 6' 0.25" (1.835 m)   Wt 230 lb 4 oz (104.4 kg)   SpO2 95%   BMI 31.01 kg/m    GEN: Well-developed,well-nourished,in no acute distress; alert,appropriate and cooperative throughout examination HEENT: Normocephalic and atraumatic without obvious abnormalities. Ears, externally no deformities PULM: Breathing comfortably in no respiratory distress EXT: No clubbing, cyanosis, or edema PSYCH: Normally interactive. Cooperative during the interview. Pleasant. Friendly and conversant. Not anxious or depressed appearing. Normal, full affect.  Range of motion at  the waist: Flexion: normal Extension: normal Lateral bending: normal Rotation: all normal  No echymosis or edema Rises to examination table with no difficulty Gait: non antalgic  Inspection/Deformity: N Paraspinus Tenderness: From L3-S1 bilaterally  B Ankle Dorsiflexion (L5,4): 5/5 B Great Toe Dorsiflexion (L5,4): 5/5 Heel Walk (L5): WNL Toe Walk (S1): WNL Rise/Squat (L4): WNL  SENSORY B Medial Foot (L4): WNL B Dorsum (L5): WNL B Lateral (S1): WNL Light Touch: WNL Pinprick: WNL  REFLEXES Knee (L4): 2+ Ankle (S1): 2+  B SLR, seated: neg B SLR, supine: neg B FABER: neg B Reverse FABER: neg B Greater Troch: NT B Log Roll: neg B Stork: NT B Sciatic Notch: NT   Radiology: XR Lumbar Spine  Result Date: 05/22/2019 CLINICAL DATA:  Low back pain for approximately 6 weeks EXAM: LUMBAR SPINE - COMPLETE 4+ VIEW COMPARISON:  None. FINDINGS: Frontal, lateral, spot lumbosacral lateral, and bilateral oblique views were obtained. There are 5 non-rib-bearing lumbar type vertebral bodies. There is no fracture or spondylolisthesis. There  is moderate disc space narrowing at T11-12 with modest disc space narrowing at T12-L1. No appreciable disc space narrowing noted in the lumbar region. There is facet osteoarthritic change at L5-S1 bilaterally. 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 Other facets appear unremarkable. No appreciable lumbar disc space narrowing. Disc space narrowing noted at T11-12 and to a lesser extent at T12-L1. No fracture or spondylolisthesis. Electronically Signed   By: Bretta Bang III M.D.   On: 05/22/2019 10:39     Assessment and Plan:     ICD-10-CM   1. Acute back pain, unspecified back location, unspecified back pain laterality  M54.9 XR Lumbar Spine  2. SI (sacroiliac) joint dysfunction  M53.3   3. Screening for diabetes mellitus (DM)  Z13.1 Hemoglobin A1c  4. Encounter for long-term (current) use of medications  Z79.899 Basic metabolic panel    CBC with Differential    Hepatic function panel  5. Screening,  lipid  Z13.220 Lipid panel  6. Screening PSA (prostate specific antigen)  Z12.5 PSA: Total, Reflex to Free  7. Grief reaction  F43.21    I agree with radiological assessment on plain lumbar film.  There is minimal arthritis and degenerative disc disease.Electronically Signed  By: [name], MD On: 05/22/2019  9:40 AM EST   He also does have some significant SI joint dysfunction.  I gave him a home rehab program.  Ongoing significant grief reaction after his son died at an early age last year.  We talked about various ways to handle this, he is going to group therapy.  Due to this distraught nature and impairment of his daily life, I do think it is reasonable to extend his medical leave from the family medical leave standpoint and I will help him do this.  Follow-up: Return in about 1 month (around 06/22/2019).  Meds ordered this encounter  Medications  . predniSONE (DELTASONE) 20 MG tablet    Sig: 2 tabs po for 7 days, then 1 tab po for 7 days    Dispense:  21  tablet    Refill:  0  . amitriptyline (ELAVIL) 25 MG tablet    Sig: Take 1-2 tablets (25-50 mg total) by mouth at bedtime.    Dispense:  60 tablet    Refill:  1   Orders Placed This Encounter  Procedures  . XR Lumbar Spine  . Basic metabolic panel  . CBC with Differential  . Hepatic function panel  . Lipid panel  . PSA: Total, Reflex to Free  . Hemoglobin A1c    Signed,  Lexxus Underhill T. Jennifer Payes, MD   Outpatient Encounter Medications as of 05/22/2019  Medication Sig  . sildenafil (REVATIO) 20 MG tablet TAKE 2-5 TABLETS BY MOUTH 30 MINUTES PRIOR TO INTERCOURSE  . [DISCONTINUED] cyclobenzaprine (FLEXERIL) 10 MG tablet Take 0.5-1 tablets (5-10 mg total) by mouth at bedtime as needed for muscle spasms.  Marland Kitchen amitriptyline (ELAVIL) 25 MG tablet Take 1-2 tablets (25-50 mg total) by mouth at bedtime.  . diazepam (VALIUM) 2 MG tablet Take 1 tablet (2 mg total) by mouth every 8 (eight) hours as needed for anxiety. (Patient not taking: Reported on 05/01/2019)  . predniSONE (DELTASONE) 20 MG tablet 2 tabs po for 7 days, then 1 tab po for 7 days  . [DISCONTINUED] predniSONE (DELTASONE) 20 MG tablet 2 tabs po daily for 5 days, then 1 tab po daily for 5 days   No facility-administered encounter medications on file as of 05/22/2019.

## 2019-05-22 ENCOUNTER — Ambulatory Visit: Payer: Federal, State, Local not specified - PPO | Admitting: Family Medicine

## 2019-05-22 ENCOUNTER — Ambulatory Visit (INDEPENDENT_AMBULATORY_CARE_PROVIDER_SITE_OTHER)
Admission: RE | Admit: 2019-05-22 | Discharge: 2019-05-22 | Disposition: A | Discharge status: Home / Self Care | Payer: Federal, State, Local not specified - PPO | Source: Ambulatory Visit | Attending: Family Medicine | Admitting: Family Medicine

## 2019-05-22 ENCOUNTER — Other Ambulatory Visit: Payer: Self-pay

## 2019-05-22 ENCOUNTER — Encounter: Payer: Self-pay | Admitting: Family Medicine

## 2019-05-22 VITALS — BP 110/70 | HR 70 | Temp 97.8°F | Ht 72.25 in | Wt 230.2 lb

## 2019-05-22 DIAGNOSIS — Z79899 Other long term (current) drug therapy: Secondary | ICD-10-CM | POA: Diagnosis not present

## 2019-05-22 DIAGNOSIS — Z13220 Encounter for screening for lipoid disorders: Secondary | ICD-10-CM

## 2019-05-22 DIAGNOSIS — Z131 Encounter for screening for diabetes mellitus: Secondary | ICD-10-CM | POA: Diagnosis not present

## 2019-05-22 DIAGNOSIS — M533 Sacrococcygeal disorders, not elsewhere classified: Secondary | ICD-10-CM

## 2019-05-22 DIAGNOSIS — M545 Low back pain: Secondary | ICD-10-CM | POA: Diagnosis not present

## 2019-05-22 DIAGNOSIS — M549 Dorsalgia, unspecified: Secondary | ICD-10-CM | POA: Diagnosis not present

## 2019-05-22 DIAGNOSIS — F4321 Adjustment disorder with depressed mood: Secondary | ICD-10-CM

## 2019-05-22 DIAGNOSIS — Z125 Encounter for screening for malignant neoplasm of prostate: Secondary | ICD-10-CM | POA: Diagnosis not present

## 2019-05-22 LAB — CBC WITH DIFFERENTIAL/PLATELET
Basophils Absolute: 0 10*3/uL (ref 0.0–0.1)
Basophils Relative: 1 % (ref 0.0–3.0)
Eosinophils Absolute: 0.1 10*3/uL (ref 0.0–0.7)
Eosinophils Relative: 3.2 % (ref 0.0–5.0)
HCT: 41.1 % (ref 39.0–52.0)
Hemoglobin: 13.5 g/dL (ref 13.0–17.0)
Lymphocytes Relative: 30.4 % (ref 12.0–46.0)
Lymphs Abs: 1.3 10*3/uL (ref 0.7–4.0)
MCHC: 32.9 g/dL (ref 30.0–36.0)
MCV: 98.5 fl (ref 78.0–100.0)
Monocytes Absolute: 0.4 10*3/uL (ref 0.1–1.0)
Monocytes Relative: 8.8 % (ref 3.0–12.0)
Neutro Abs: 2.4 10*3/uL (ref 1.4–7.7)
Neutrophils Relative %: 56.6 % (ref 43.0–77.0)
Platelets: 233 10*3/uL (ref 150.0–400.0)
RBC: 4.18 Mil/uL — ABNORMAL LOW (ref 4.22–5.81)
RDW: 13.2 % (ref 11.5–15.5)
WBC: 4.3 10*3/uL (ref 4.0–10.5)

## 2019-05-22 LAB — HEPATIC FUNCTION PANEL
ALT: 18 U/L (ref 0–53)
AST: 19 U/L (ref 0–37)
Albumin: 4.3 g/dL (ref 3.5–5.2)
Alkaline Phosphatase: 47 U/L (ref 39–117)
Bilirubin, Direct: 0.2 mg/dL (ref 0.0–0.3)
Total Bilirubin: 0.9 mg/dL (ref 0.2–1.2)
Total Protein: 7.2 g/dL (ref 6.0–8.3)

## 2019-05-22 LAB — BASIC METABOLIC PANEL
BUN: 13 mg/dL (ref 6–23)
CO2: 31 mEq/L (ref 19–32)
Calcium: 9.7 mg/dL (ref 8.4–10.5)
Chloride: 103 mEq/L (ref 96–112)
Creatinine, Ser: 0.97 mg/dL (ref 0.40–1.50)
GFR: 96.68 mL/min (ref >60.00)
Glucose, Bld: 84 mg/dL (ref 70–99)
Potassium: 4.7 mEq/L (ref 3.5–5.1)
Sodium: 139 mEq/L (ref 135–145)

## 2019-05-22 LAB — LIPID PANEL
Cholesterol: 198 mg/dL (ref 0–200)
HDL: 47.6 mg/dL (ref >39.00)
LDL Cholesterol: 135 mg/dL — ABNORMAL HIGH (ref 0–99)
NonHDL: 150.76
Total CHOL/HDL Ratio: 4
Triglycerides: 80 mg/dL (ref 0.0–149.0)
VLDL: 16 mg/dL (ref 0.0–40.0)

## 2019-05-22 LAB — HEMOGLOBIN A1C: Hgb A1c MFr Bld: 4.7 % (ref 4.6–6.5)

## 2019-05-22 MED ORDER — PREDNISONE 20 MG PO TABS: ORAL_TABLET | ORAL | 0 refills | Status: DC

## 2019-05-22 MED ORDER — AMITRIPTYLINE HCL 25 MG PO TABS: 25.0000 mg | ORAL_TABLET | Freq: Every day | ORAL | 1 refills | Status: DC

## 2019-05-22 NOTE — Patient Instructions (Signed)
Try the amitryptiline for 1 week, then if it is ok, increase to 2 tablets before bed.

## 2019-05-23 LAB — PSA, TOTAL WITH REFLEX TO PSA, FREE: PSA, Total: 0.5 ng/mL (ref <=4.0)

## 2019-07-09 ENCOUNTER — Ambulatory Visit: Payer: Federal, State, Local not specified - PPO | Attending: Internal Medicine

## 2019-07-09 DIAGNOSIS — Z20822 Contact with and (suspected) exposure to covid-19: Secondary | ICD-10-CM

## 2019-07-10 LAB — NOVEL CORONAVIRUS, NAA: SARS-CoV-2, NAA: NOT DETECTED

## 2019-07-16 DIAGNOSIS — M545 Low back pain: Secondary | ICD-10-CM | POA: Diagnosis not present

## 2019-07-16 DIAGNOSIS — M533 Sacrococcygeal disorders, not elsewhere classified: Secondary | ICD-10-CM | POA: Diagnosis not present

## 2019-07-16 DIAGNOSIS — M47816 Spondylosis without myelopathy or radiculopathy, lumbar region: Secondary | ICD-10-CM | POA: Diagnosis not present

## 2019-08-08 DIAGNOSIS — M1712 Unilateral primary osteoarthritis, left knee: Secondary | ICD-10-CM | POA: Diagnosis not present

## 2019-08-08 DIAGNOSIS — M25462 Effusion, left knee: Secondary | ICD-10-CM | POA: Diagnosis not present

## 2019-08-08 DIAGNOSIS — G8929 Other chronic pain: Secondary | ICD-10-CM | POA: Diagnosis not present

## 2019-08-08 DIAGNOSIS — M23222 Derangement of posterior horn of medial meniscus due to old tear or injury, left knee: Secondary | ICD-10-CM | POA: Diagnosis not present

## 2019-08-08 DIAGNOSIS — M25562 Pain in left knee: Secondary | ICD-10-CM | POA: Diagnosis not present

## 2019-08-14 DIAGNOSIS — M1712 Unilateral primary osteoarthritis, left knee: Secondary | ICD-10-CM | POA: Diagnosis not present

## 2019-08-14 DIAGNOSIS — M23222 Derangement of posterior horn of medial meniscus due to old tear or injury, left knee: Secondary | ICD-10-CM | POA: Diagnosis not present

## 2019-08-14 DIAGNOSIS — G8929 Other chronic pain: Secondary | ICD-10-CM | POA: Diagnosis not present

## 2019-08-14 DIAGNOSIS — M25562 Pain in left knee: Secondary | ICD-10-CM | POA: Diagnosis not present

## 2019-08-14 DIAGNOSIS — M25462 Effusion, left knee: Secondary | ICD-10-CM | POA: Diagnosis not present

## 2019-08-22 DIAGNOSIS — M1712 Unilateral primary osteoarthritis, left knee: Secondary | ICD-10-CM | POA: Diagnosis not present

## 2019-08-22 DIAGNOSIS — M25462 Effusion, left knee: Secondary | ICD-10-CM | POA: Diagnosis not present

## 2019-08-22 DIAGNOSIS — M23222 Derangement of posterior horn of medial meniscus due to old tear or injury, left knee: Secondary | ICD-10-CM | POA: Diagnosis not present

## 2019-08-22 DIAGNOSIS — M25562 Pain in left knee: Secondary | ICD-10-CM | POA: Diagnosis not present

## 2019-08-28 DIAGNOSIS — M1712 Unilateral primary osteoarthritis, left knee: Secondary | ICD-10-CM | POA: Diagnosis not present

## 2019-08-28 DIAGNOSIS — M23222 Derangement of posterior horn of medial meniscus due to old tear or injury, left knee: Secondary | ICD-10-CM | POA: Diagnosis not present

## 2019-08-28 DIAGNOSIS — M25562 Pain in left knee: Secondary | ICD-10-CM | POA: Diagnosis not present

## 2019-08-28 DIAGNOSIS — M25462 Effusion, left knee: Secondary | ICD-10-CM | POA: Diagnosis not present

## 2019-09-18 DIAGNOSIS — M25562 Pain in left knee: Secondary | ICD-10-CM | POA: Diagnosis not present

## 2019-09-23 DIAGNOSIS — M25562 Pain in left knee: Secondary | ICD-10-CM | POA: Diagnosis not present

## 2019-09-25 DIAGNOSIS — M25562 Pain in left knee: Secondary | ICD-10-CM | POA: Diagnosis not present

## 2019-10-02 DIAGNOSIS — M25562 Pain in left knee: Secondary | ICD-10-CM | POA: Diagnosis not present

## 2019-11-14 ENCOUNTER — Encounter: Payer: Self-pay | Admitting: Family Medicine

## 2019-11-14 ENCOUNTER — Ambulatory Visit: Payer: Federal, State, Local not specified - PPO | Admitting: Family Medicine

## 2019-11-14 ENCOUNTER — Other Ambulatory Visit: Payer: Self-pay

## 2019-11-14 ENCOUNTER — Ambulatory Visit (INDEPENDENT_AMBULATORY_CARE_PROVIDER_SITE_OTHER)
Admission: RE | Admit: 2019-11-14 | Discharge: 2019-11-14 | Disposition: A | Discharge status: Home / Self Care | Payer: Federal, State, Local not specified - PPO | Source: Ambulatory Visit | Attending: Family Medicine | Admitting: Family Medicine

## 2019-11-14 VITALS — BP 110/84 | HR 71 | Temp 98.1°F | Ht 72.25 in | Wt 233.2 lb

## 2019-11-14 DIAGNOSIS — F4321 Adjustment disorder with depressed mood: Secondary | ICD-10-CM

## 2019-11-14 DIAGNOSIS — M24811 Other specific joint derangements of right shoulder, not elsewhere classified: Secondary | ICD-10-CM

## 2019-11-14 DIAGNOSIS — M25511 Pain in right shoulder: Secondary | ICD-10-CM

## 2019-11-14 DIAGNOSIS — S4991XA Unspecified injury of right shoulder and upper arm, initial encounter: Secondary | ICD-10-CM | POA: Diagnosis not present

## 2019-11-14 MED ORDER — DIAZEPAM 2 MG PO TABS: 2.0000 mg | ORAL_TABLET | Freq: Three times a day (TID) | ORAL | 0 refills | Status: DC | PRN

## 2019-11-14 NOTE — Patient Instructions (Signed)
You need to be working on your motion multiple times a day.  Pendulums and use the desk to help you with your motion.
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
## 2020-04-23 ENCOUNTER — Ambulatory Visit: Payer: Federal, State, Local not specified - PPO | Admitting: Internal Medicine

## 2020-04-23 ENCOUNTER — Other Ambulatory Visit: Payer: Self-pay

## 2020-04-23 ENCOUNTER — Encounter: Payer: Self-pay | Admitting: Internal Medicine

## 2020-04-23 VITALS — BP 110/70 | HR 59 | Temp 97.5°F | Ht 72.5 in | Wt 233.0 lb

## 2020-04-23 DIAGNOSIS — S60222A Contusion of left hand, initial encounter: Secondary | ICD-10-CM

## 2020-04-23 DIAGNOSIS — Z23 Encounter for immunization: Secondary | ICD-10-CM | POA: Diagnosis not present

## 2020-04-23 DIAGNOSIS — S60229A Contusion of unspecified hand, initial encounter: Secondary | ICD-10-CM | POA: Insufficient documentation

## 2020-04-23 NOTE — Addendum Note (Signed)
Addended by: Eual Fines on: 04/23/2020 04:42 PM   Modules accepted: Orders
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
## 2020-04-23 NOTE — Assessment & Plan Note (Addendum)
Seems to have had puncture wound---with secondary bleeding (didn't bleed at first but then got big quickly today). Discussed ice Wrap to protect when at work Discussed it should resolve over time---but will likely take weeks Update Td

## 2020-05-15 DIAGNOSIS — Z03818 Encounter for observation for suspected exposure to other biological agents ruled out: Secondary | ICD-10-CM | POA: Diagnosis not present

## 2020-05-15 DIAGNOSIS — Z1152 Encounter for screening for COVID-19: Secondary | ICD-10-CM | POA: Diagnosis not present

## 2020-06-18 DIAGNOSIS — M25562 Pain in left knee: Secondary | ICD-10-CM | POA: Diagnosis not present

## 2020-06-18 DIAGNOSIS — M62838 Other muscle spasm: Secondary | ICD-10-CM | POA: Diagnosis not present

## 2020-06-18 DIAGNOSIS — M25511 Pain in right shoulder: Secondary | ICD-10-CM | POA: Diagnosis not present

## 2020-06-18 DIAGNOSIS — M25462 Effusion, left knee: Secondary | ICD-10-CM | POA: Diagnosis not present

## 2020-07-08 ENCOUNTER — Ambulatory Visit: Payer: Self-pay

## 2020-07-08 ENCOUNTER — Other Ambulatory Visit: Payer: Self-pay

## 2020-07-08 ENCOUNTER — Ambulatory Visit (INDEPENDENT_AMBULATORY_CARE_PROVIDER_SITE_OTHER): Payer: Federal, State, Local not specified - PPO | Admitting: Orthopedic Surgery

## 2020-07-08 VITALS — Ht 74.0 in | Wt 225.0 lb

## 2020-07-08 DIAGNOSIS — M25562 Pain in left knee: Secondary | ICD-10-CM

## 2020-07-12 ENCOUNTER — Encounter: Payer: Self-pay | Admitting: Orthopedic Surgery
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
## 2020-10-08 DIAGNOSIS — M25562 Pain in left knee: Secondary | ICD-10-CM | POA: Diagnosis not present

## 2020-10-08 DIAGNOSIS — G8929 Other chronic pain: Secondary | ICD-10-CM | POA: Diagnosis not present

## 2020-10-08 DIAGNOSIS — M1712 Unilateral primary osteoarthritis, left knee: Secondary | ICD-10-CM | POA: Diagnosis not present

## 2020-10-08 DIAGNOSIS — M25462 Effusion, left knee: Secondary | ICD-10-CM | POA: Diagnosis not present

## 2020-11-02 ENCOUNTER — Encounter: Payer: Self-pay | Admitting: Family Medicine

## 2020-11-02 ENCOUNTER — Ambulatory Visit: Payer: Federal, State, Local not specified - PPO | Admitting: Family Medicine

## 2020-11-02 ENCOUNTER — Other Ambulatory Visit: Payer: Self-pay

## 2020-11-02 VITALS — BP 112/70 | HR 62 | Temp 97.9°F | Ht 72.25 in | Wt 230.0 lb

## 2020-11-02 DIAGNOSIS — M1712 Unilateral primary osteoarthritis, left knee: Secondary | ICD-10-CM | POA: Diagnosis not present

## 2020-11-02 DIAGNOSIS — Z13220 Encounter for screening for lipoid disorders: Secondary | ICD-10-CM | POA: Diagnosis not present

## 2020-11-02 DIAGNOSIS — Z125 Encounter for screening for malignant neoplasm of prostate: Secondary | ICD-10-CM

## 2020-11-02 DIAGNOSIS — Z131 Encounter for screening for diabetes mellitus: Secondary | ICD-10-CM | POA: Diagnosis not present

## 2020-11-02 DIAGNOSIS — R5383 Other fatigue: Secondary | ICD-10-CM

## 2020-11-02 DIAGNOSIS — R6882 Decreased libido: Secondary | ICD-10-CM

## 2020-11-02 MED ORDER — SILDENAFIL CITRATE 20 MG PO TABS: ORAL_TABLET | ORAL | 11 refills | Status: DC

## 2020-11-02 NOTE — Progress Notes (Signed)
Kallie Depolo T. Jammie Clink, MD, CAQ Sports Medicine [facility] at [facility] [address] [place], [address]  Phone: [phone]  FAX: [phone]  Darius Solis - 60 y.o. male  MRN [id number]  Date of Birth: December 05, 1960  Date: 11/02/2020  PCP: Hannah Beat, MD  Referral: Hannah Beat, MD  Chief Complaint  Patient presents with   Knee Pain    Left    This visit occurred during the SARS-CoV-2 public health emergency.  Safety protocols were in place, including screening questions prior to the visit, additional usage of staff PPE, and extensive cleaning of exam room while observing appropriate contact time as indicated for disinfecting solutions.   Subjective:   Darius Solis is a 60 y.o. very pleasant male patient with Body mass index is 30.98 kg/m. who presents with the following:  He wanted to follow-up and talk to me about few different things. Saw Dorene Grebe.  Knee oa: This was on the left, he does have some moderate arthritis. Doing some cardio Elliptical Strength training.  He thinks that all of this helps, he does a good job of some strengthening of the quadricep as well as the glutes.  He is doing some cardio as well, and thinks the elliptical feels a lot better than a treadmill.  Some BPH, only once. He is having to go somewhat more frequency and he has an incomplete sensation of lately emptying his bladder.  He is going to the bathroom about 1 time nightly.  He also has not had any routine blood work in approximately 18 months.   Immunization History  Administered Date(s) Administered   Influenza Split 03/02/2011   Influenza,inj,Quad PF,6+ Mos 04/24/2013, 04/27/2016, 01/24/2017   PFIZER(Purple Top)SARS-COV-2 Vaccination 07/25/2019, 08/22/2019   Td 07/09/2009, 04/23/2020    Covid booster  Health Maintenance  Topic Date Due   Pneumococcal Vaccine 20-21 Years old (1 - PCV) Never done   Zoster Vaccines- Shingrix (1 of 2) Never  done   COVID-19 Vaccine (3 - Pfizer risk series) 09/19/2019   INFLUENZA VACCINE  12/14/2020   COLONOSCOPY (Pts 45-38yrs Insurance coverage will need to be confirmed)  03/07/2024   TETANUS/TDAP  04/23/2030   Hepatitis C Screening  Completed   HIV Screening  Completed   HPV VACCINES  Aged Out     Review of Systems is noted in the HPI, as appropriate   Objective:   BP 112/70   Pulse 62   Temp 97.9 F (36.6 C) (Temporal)   Ht 6' 0.25" (1.835 m)   Wt 230 lb (104.3 kg)   SpO2 95%   BMI 30.98 kg/m    GEN: WDWN, NAD, Non-toxic HEENT: Atraumatic, Normocephalic. Neck supple. No masses. CV: RRR, No M/G/R. No JVD. No thrill. No extra heart sounds. PULM: CTA B, no wheezes, crackles, rhonchi. No retractions. No resp. distress. No accessory muscle use. EXTR: No c/c/e NEURO Normal gait.  PSYCH: Normally interactive. Conversant.    Left knee, he does lack 5 degrees of extension he can flex to approximately 115.  Stable to varus and valgus stress.  He does have grinding at the patellofemoral joint.  Does have medial and lateral joint line tenderness.  Forced flexion causes pain as well as McMurray's without mechanical symptoms.  No significant effusion today.  Radiology: No results found.  Assessment and Plan:     ICD-10-CM   1. Primary osteoarthritis of left knee  M17.12     2. Screening, lipid  Z13.220 Lipid panel  3. Screening PSA (prostate specific antigen)  Z12.5 PSA, Total with Reflex to PSA, Free    4. Screening for diabetes mellitus (DM)  Z13.1 Basic metabolic panel    Hemoglobin A1c    5. Decreased libido  R68.82 Testos,Total,Free and SHBG (Male)    6. Other fatigue  R53.83 CBC with Differential/Platelet    Hepatic function panel     We talked about arthritis in general, and I think he is doing very good job using some nonpharmacological treatment and weight training to help muscular control decrease impact to the knee.  Also going to refill his sildenafil and  check a testosterone, given his some ED and decreased libido.  Routine general lab work as well.  Meds ordered this encounter  Medications   sildenafil (REVATIO) 20 MG tablet    Sig: TAKE 2-5 TABLETS BY MOUTH 30 MINUTES PRIOR TO INTERCOURSE    Dispense:  50 tablet    Refill:  11    Patient will be using a GoodRx Coupon   Medications Discontinued During This Encounter  Medication Reason   diazepam (VALIUM) 2 MG tablet Completed Course   celecoxib (CELEBREX) 200 MG capsule Completed Course   sildenafil (REVATIO) 20 MG tablet Reorder   Orders Placed This Encounter  Procedures   Basic metabolic panel   CBC with Differential/Platelet   Hepatic function panel   Lipid panel   PSA, Total with Reflex to PSA, Free   Hemoglobin A1c   Testos,Total,Free and SHBG (Male)    Follow-up: Return for lab follow-up fasting appointment.  Signed,  Elpidio Galea. Bryttney Netzer, MD   Outpatient Encounter Medications as of 11/02/2020  Medication Sig   celecoxib (CELEBREX) 100 MG capsule Take 100 mg by mouth 2 (two) times daily.   [DISCONTINUED] celecoxib (CELEBREX) 200 MG capsule Take by mouth.   [DISCONTINUED] sildenafil (REVATIO) 20 MG tablet TAKE 2-5 TABLETS BY MOUTH 30 MINUTES PRIOR TO INTERCOURSE   sildenafil (REVATIO) 20 MG tablet TAKE 2-5 TABLETS BY MOUTH 30 MINUTES PRIOR TO INTERCOURSE   [DISCONTINUED] diazepam (VALIUM) 2 MG tablet Take 1 tablet (2 mg total) by mouth every 8 (eight) hours as needed for anxiety.   No facility-administered encounter medications on file as of 11/02/2020.

## 2020-11-04 ENCOUNTER — Other Ambulatory Visit: Payer: Self-pay

## 2020-11-04 ENCOUNTER — Other Ambulatory Visit (INDEPENDENT_AMBULATORY_CARE_PROVIDER_SITE_OTHER): Payer: Federal, State, Local not specified - PPO

## 2020-11-04 DIAGNOSIS — R5383 Other fatigue: Secondary | ICD-10-CM

## 2020-11-04 DIAGNOSIS — Z125 Encounter for screening for malignant neoplasm of prostate: Secondary | ICD-10-CM

## 2020-11-04 DIAGNOSIS — R6882 Decreased libido: Secondary | ICD-10-CM | POA: Diagnosis not present

## 2020-11-04 DIAGNOSIS — Z131 Encounter for screening for diabetes mellitus: Secondary | ICD-10-CM

## 2020-11-04 DIAGNOSIS — Z13220 Encounter for screening for lipoid disorders: Secondary | ICD-10-CM

## 2020-11-04 LAB — HEMOGLOBIN A1C: Hgb A1c MFr Bld: 4.8 % (ref 4.6–6.5)

## 2020-11-04 LAB — BASIC METABOLIC PANEL
BUN: 14 mg/dL (ref 6–23)
CO2: 30 mEq/L (ref 19–32)
Calcium: 9.6 mg/dL (ref 8.4–10.5)
Chloride: 104 mEq/L (ref 96–112)
Creatinine, Ser: 0.97 mg/dL (ref 0.40–1.50)
GFR: 86.35 mL/min (ref >60.00)
Glucose, Bld: 87 mg/dL (ref 70–99)
Potassium: 4.2 mEq/L (ref 3.5–5.1)
Sodium: 140 mEq/L (ref 135–145)

## 2020-11-04 LAB — CBC WITH DIFFERENTIAL/PLATELET
Basophils Absolute: 0 10*3/uL (ref 0.0–0.1)
Basophils Relative: 0.8 % (ref 0.0–3.0)
Eosinophils Absolute: 0.1 10*3/uL (ref 0.0–0.7)
Eosinophils Relative: 1.8 % (ref 0.0–5.0)
HCT: 38.3 % — ABNORMAL LOW (ref 39.0–52.0)
Hemoglobin: 12.9 g/dL — ABNORMAL LOW (ref 13.0–17.0)
Lymphocytes Relative: 33 % (ref 12.0–46.0)
Lymphs Abs: 1.6 10*3/uL (ref 0.7–4.0)
MCHC: 33.7 g/dL (ref 30.0–36.0)
MCV: 96.8 fl (ref 78.0–100.0)
Monocytes Absolute: 0.5 10*3/uL (ref 0.1–1.0)
Monocytes Relative: 9.8 % (ref 3.0–12.0)
Neutro Abs: 2.6 10*3/uL (ref 1.4–7.7)
Neutrophils Relative %: 54.6 % (ref 43.0–77.0)
Platelets: 238 10*3/uL (ref 150.0–400.0)
RBC: 3.96 Mil/uL — ABNORMAL LOW (ref 4.22–5.81)
RDW: 13.3 % (ref 11.5–15.5)
WBC: 4.7 10*3/uL (ref 4.0–10.5)

## 2020-11-04 LAB — HEPATIC FUNCTION PANEL
ALT: 16 U/L (ref 0–53)
AST: 18 U/L (ref 0–37)
Albumin: 4.2 g/dL (ref 3.5–5.2)
Alkaline Phosphatase: 40 U/L (ref 39–117)
Bilirubin, Direct: 0.1 mg/dL (ref 0.0–0.3)
Total Bilirubin: 0.9 mg/dL (ref 0.2–1.2)
Total Protein: 6.9 g/dL (ref 6.0–8.3)

## 2020-11-04 LAB — LIPID PANEL
Cholesterol: 196 mg/dL (ref 0–200)
HDL: 56.4 mg/dL (ref >39.00)
LDL Cholesterol: 128 mg/dL — ABNORMAL HIGH (ref 0–99)
NonHDL: 139.69
Total CHOL/HDL Ratio: 3
Triglycerides: 60 mg/dL (ref 0.0–149.0)
VLDL: 12 mg/dL (ref 0.0–40.0)

## 2020-11-09 LAB — PSA, TOTAL WITH REFLEX TO PSA, FREE: PSA, Total: 0.5 ng/mL (ref <=4.0)

## 2020-11-09 LAB — TESTOS,TOTAL,FREE AND SHBG (FEMALE)
Free Testosterone: 63.7 pg/mL (ref 35.0–155.0)
Sex Hormone Binding: 57 nmol/L (ref 22–77)
Testosterone, Total, LC-MS-MS: 514 ng/dL (ref 250–1100)

## 2021-02-25 DIAGNOSIS — Z03818 Encounter for observation for suspected exposure to other biological agents ruled out: Secondary | ICD-10-CM | POA: Diagnosis not present

## 2021-02-25 DIAGNOSIS — Z20822 Contact with and (suspected) exposure to covid-19: Secondary | ICD-10-CM | POA: Diagnosis not present

## 2021-03-18 ENCOUNTER — Other Ambulatory Visit: Payer: Self-pay

## 2021-03-18 ENCOUNTER — Encounter: Payer: Self-pay | Admitting: Family Medicine

## 2021-03-18 ENCOUNTER — Telehealth (INDEPENDENT_AMBULATORY_CARE_PROVIDER_SITE_OTHER): Payer: Federal, State, Local not specified - PPO | Admitting: Family Medicine

## 2021-03-18 VITALS — Ht 72.25 in

## 2021-03-18 DIAGNOSIS — J208 Acute bronchitis due to other specified organisms: Secondary | ICD-10-CM | POA: Diagnosis not present

## 2021-03-18 MED ORDER — DOXYCYCLINE HYCLATE 100 MG PO TABS: 100.0000 mg | ORAL_TABLET | Freq: Two times a day (BID) | ORAL | 0 refills | Status: DC

## 2021-03-18 NOTE — Progress Notes (Signed)
y     Karleen Hampshire T. Tehani Mersman, MD Primary Care and Sports Medicine [facility] at [facility] [address] [place], [address] Phone: [phone]  FAX: [phone]  Darius Solis - 60 y.o. male  MRN [id number]  Date of Birth: 09-01-1960  Visit Date: 03/18/2021  PCP: Hannah Beat, MD  Referred by: Hannah Beat, MD  Virtual Visit via Video Note:  I connected with  Darius Solis on 03/18/2021  3:40 PM EDT by a video enabled telemedicine application and verified that I am speaking with the correct person using two identifiers.   Location patient: home computer, tablet, or smartphone Location provider: work or home office Consent: Verbal consent directly obtained from [employer]. Persons participating in the virtual visit: patient, provider  I discussed the limitations of evaluation and management by telemedicine and the availability of in person appointments. The patient expressed understanding and agreed to proceed.  Chief Complaint  Patient presents with   Cough    With phlegm yellow-started on Sunday-Negative Covid test on Monday   Sore Throat    History of Present Illness:  I need him to get a COVID-19 test.  He and his wife are going to go get 1.  He tested 24 hours into symptoms, now he has that day 5.  Globally, he is quite healthy. Bad cough and mucous in his chest.  Used some mucinex.  Started Sunday and through until now.   No fever.   Feels tired. Spitting. Up phlegm.  No body aches.  No Gi symptoms.   Started with some rhinitis.  Covid on Monday test only. Negative  Tussin is not helping much with his cough.   Immunization History  Administered Date(s) Administered   Influenza Split 03/02/2011   Influenza,inj,Quad PF,6+ Mos 04/24/2013, 04/27/2016, 01/24/2017   PFIZER(Purple Top)SARS-COV-2 Vaccination 07/25/2019, 08/22/2019   Td 07/09/2009, 04/23/2020     Review of Systems as above: See pertinent positives and  pertinent negatives per HPI No acute distress verbally   Observations/Objective/Exam:  An attempt was made to discern vital signs over the phone and per patient if applicable and possible.   General:    Alert, Oriented, appears well and in no acute distress  Pulmonary:     On inspection no signs of respiratory distress.  Psych / Neurological:     Pleasant and cooperative.  Assessment and Plan:    ICD-10-CM   1. Acute bronchitis due to other specified organisms  J20.8      Covid test is negative on repeat.  Continue supportive care, and I am also going to start him on Doxy.  I discussed the assessment and treatment plan with the patient. The patient was provided an opportunity to ask questions and all were answered. The patient agreed with the plan and demonstrated an understanding of the instructions.   The patient was advised to call back or seek an in-person evaluation if the symptoms worsen or if the condition fails to improve as anticipated.  Follow-up: prn unless noted otherwise below No follow-ups on file.  Meds ordered this encounter  Medications   doxycycline (VIBRA-TABS) 100 MG tablet    Sig: Take 1 tablet (100 mg total) by mouth 2 (two) times daily.    Dispense:  20 tablet    Refill:  0   No orders of the defined types were placed in this encounter.   Signed,  Elpidio Galea. Juanita Streight, MD

## 2021-07-07 ENCOUNTER — Ambulatory Visit: Payer: Self-pay

## 2021-07-07 ENCOUNTER — Other Ambulatory Visit: Payer: Self-pay

## 2021-07-07 ENCOUNTER — Ambulatory Visit: Payer: Federal, State, Local not specified - PPO | Admitting: Surgical

## 2021-07-07 DIAGNOSIS — M25562 Pain in left knee: Secondary | ICD-10-CM

## 2021-07-07 DIAGNOSIS — M1712 Unilateral primary osteoarthritis, left knee: Secondary | ICD-10-CM

## 2021-07-11 ENCOUNTER — Encounter: Payer: Self-pay | Admitting: Orthopedic Surgery

## 2021-07-11 MED ORDER — LIDOCAINE HCL 1 % IJ SOLN
5.0000 mL | INTRAMUSCULAR | Status: AC | PRN
  Administered 2021-07-07: 5 mL

## 2021-07-11 MED ORDER — METHYLPREDNISOLONE ACETATE 40 MG/ML IJ SUSP
40.0000 mg | INTRAMUSCULAR | Status: AC | PRN
  Administered 2021-07-07: 40 mg via INTRA_ARTICULAR

## 2021-07-11 MED ORDER — BUPIVACAINE HCL 0.25 % IJ SOLN
4.0000 mL | INTRAMUSCULAR | Status: AC | PRN
  Administered 2021-07-07: 4 mL via INTRA_ARTICULAR

## 2021-07-11 NOTE — Progress Notes (Signed)
Office Visit Note   Patient: Paige Vanderwoude           Date of Birth: 30-Nov-1960           MRN: [id number] Visit Date: 07/07/2021 Requested by: Hannah Beat, MD [address] [place],  [place] [address] PCP: Hannah Beat, MD  Subjective: Chief Complaint  Patient presents with   Left Knee - Pain    HPI: Carless Slatten is a 61 y.o. male who presents to the office complaining of left knee pain.  Patient complains of chronic left knee pain that has been worse for about 3 months.  Mostly localizes pain to the lateral and posterior aspects of the left knee.  He has a history of a torn meniscus by his history.  He denies any instability of the knee or locking/mechanical symptoms.  He uses a brace which helps somewhat with pain.  He mostly reports pain with driving or sitting for more than 15 minutes at a time.  He feels stretching is helpful to prevent the stiffness and pain incessant with immobility.  He takes Celebrex which helps as well.  Works at [employer] which involves 10-hour days and a lot of standing.  Denies any history of CKD, cardiac disease, smoking history, diabetes.  No history of prior knee surgery..                ROS: All systems reviewed are negative as they relate to the chief complaint within the history of present illness.  Patient denies fevers or chills.  Assessment & Plan: Visit Diagnoses:  1. Left knee pain, unspecified chronicity     Plan: Patient is a 61 year old male who presents for evaluation of left knee pain.  He has moderate to severe degenerative changes in the left knee that has flared up in the last several months.  No recent injury.  He has well-preserved range of motion on exam with effusion.  He works a very physical job which involves a lot of standing and walking and working long days.  After discussion of options for his left knee pain, he would like to continue with Celebrex and try a cortisone injection today.  He also would like  to try physical therapy for his left knee which he has done in the past with good improvement in his knee pain.  He will be set up for physical therapy upstairs.  He had left knee cortisone injection with 15 cc aspiration of synovial fluid that did not appear purulent.  Follow-up with the office as needed if pain does not improve.  Follow-Up Instructions: No follow-ups on file.   Orders:  Orders Placed This Encounter  Procedures   XR KNEE 3 VIEW LEFT   Ambulatory referral to Physical Therapy   No orders of the defined types were placed in this encounter.     Procedures: Large Joint Inj: L knee on 07/07/2021 10:32 AM Indications: diagnostic evaluation, joint swelling and pain Details: 18 G 1.5 in needle, superolateral approach  Arthrogram: No  Medications: 5 mL lidocaine 1 %; 40 mg methylPREDNISolone acetate 40 MG/ML; 4 mL bupivacaine 0.25 % Aspirate: 15 mL Outcome: tolerated well, no immediate complications Procedure, treatment alternatives, risks and benefits explained, specific risks discussed. Consent was given by the patient. Immediately prior to procedure a time out was called to verify the correct patient, procedure, equipment, support staff and site/side marked as required. Patient was prepped and draped in the usual sterile fashion.  Clinical Data: No additional findings.  Objective: Vital Signs: There were no vitals taken for this visit.  Physical Exam:  Constitutional: Patient appears well-developed HEENT:  Head: Normocephalic Eyes:EOM are normal Neck: Normal range of motion Cardiovascular: Normal rate Pulmonary/chest: Effort normal Neurologic: Patient is alert Skin: Skin is warm Psychiatric: Patient has normal mood and affect  Ortho Exam: Ortho exam demonstrates left knee with 3 degrees extension and 125 degrees of knee flexion.  Positive effusion noted.  No warmth or cellulitis or sinus tract noted around the left knee.  Tenderness primarily over the  lateral joint line with mild tenderness over the medial joint line.  No calf tenderness.  Negative Homans' sign.  Patient is able to perform straight leg raise without extensor lag.  No pain with hip range of motion.  Negative straight leg raise.  Specialty Comments:  No specialty comments available.  Imaging: No results found.   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  No past surgical history on file. 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

## 2021-07-27 ENCOUNTER — Ambulatory Visit: Payer: Federal, State, Local not specified - PPO | Admitting: Physical Therapy

## 2021-07-27 ENCOUNTER — Other Ambulatory Visit: Payer: Self-pay

## 2021-07-27 ENCOUNTER — Encounter: Payer: Self-pay | Admitting: Physical Therapy

## 2021-07-27 DIAGNOSIS — G8929 Other chronic pain: Secondary | ICD-10-CM | POA: Diagnosis not present

## 2021-07-27 DIAGNOSIS — M25562 Pain in left knee: Secondary | ICD-10-CM

## 2021-07-27 DIAGNOSIS — R262 Difficulty in walking, not elsewhere classified: Secondary | ICD-10-CM | POA: Diagnosis not present

## 2021-07-27 NOTE — Therapy (Signed)
?OUTPATIENT PHYSICAL THERAPY LOWER EXTREMITY EVALUATION ? ? ?Patient Name: Darius Solis ?MRN: [id number] ?DOB:1960-11-19, 61 y.o., male ?Today's Date: 07/27/2021 ? ? PT End of Session - 07/27/21 1105   ? ? Visit Number 1   ? Number of Visits 3   ? Date for PT Re-Evaluation 09/10/21   ? PT Start Time 1100   ? PT Stop Time 1142   ? PT Time Calculation (min) 42 min   ? Activity Tolerance Patient tolerated treatment well   ? [facility] for tasks assessed/performed   ? ?  ?  ? ?  ? ? ?Past Medical History:  ?Diagnosis Date  ? Allergy   ? ?History reviewed. No pertinent surgical history. ?Patient Active Problem List  ? Diagnosis Date Noted  ? Traumatic hematoma of hand 04/23/2020  ? 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 ? ?PCP: Hannah Beatopland, Spencer, MD ? ?REFERRING PROVIDER: Cammy Copaean, Gregory Scott, MD ? ?REFERRING DIAG: M25.562 (ICD-10-CM) - Left knee pain, unspecified chronicity ?THERAPY DIAG:  ?Chronic pain of left knee - Plan: PT plan of care cert/re-cert ? ?Difficulty in walking, not elsewhere classified - Plan: PT plan of care cert/re-cert ? ?ONSET DATE: Pt stating knee pain has been ongoing for years ? ?SUBJECTIVE:  ? ?SUBJECTIVE STATEMENT: ?Pt stating knee pain is 7/10, pt s/p steroid injection in left knee on 07/07/2021.  ? ?PERTINENT HISTORY: ?Left knee OA, allergies ? ?PAIN:  ?Are you having pain? Yes: NPRS scale: 7/10 ?Pain location: left lateral knee along joint line ?Pain description: achy and progressing to throbbing  ?Aggravating factors: standing and walking at work 8 hour shifts on concrete floors ?Relieving factors: ice ? ?PRECAUTIONS: None ? ?WEIGHT BEARING RESTRICTIONS No ? ?FALLS:  ?Has patient fallen in last 6 months? No, Number of falls: 0 ? ?LIVING ENVIRONMENT: ?Lives with: lives with their family and lives with their spouse ?Lives in: House/apartment ?Stairs: Yes; Internal: 14 steps; on right going up ?Has following equipment at home: None ? ?OCCUPATION: works at [employer] ? ?PLOF:  Independent ? ?PATIENT GOALS    Walk and work without pain ? ? ?OBJECTIVE:  ? ?DIAGNOSTIC FINDINGS:  07/07/2021 ?AP, lateral, sunrise views of left knee reviewed.  Moderate degenerative  ?changes noted in the medial and lateral compartment with subchondral  ?sclerosis and osteophyte formation as well as joint space narrowing.    ?Slight lateral subluxation of the tibia relative to the femur.  More  ?severe degenerative changes noted in the patellofemoral compartment with  ?subchondral sclerosis, joint space narrowing, osteophyte formation.  No  ?significant abnormal patellar height ? ?PATIENT SURVEYS:  ?07/27/2021: FOTO 44% (predicted 61%) ? ?COGNITION: ? Overall cognitive status: Within functional limits for tasks assessed   ?  ?SENSATION: ?WFL ? ?EDEMA:  R knee circumference: 44.5 centimeters ?                L knee circumference:  46 centimeters ?                R thigh circumference: 56 centimeters ?                L thigh circumference: 50 centimeters ? ?MUSCLE LENGTH: ?Hamstrings: Right 70 deg; Left 65 deg ? ? ?POSTURE:  ?Forward head ? ?PALPATION: ?TTP; lateral joint line,  ? ?LE ROM: ? ?Active ROM Right ?07/27/2021 Left ?07/27/2021  ?Hip flexion    ?Hip extension    ?Hip abduction    ?Hip adduction    ?Hip internal rotation    ?  Hip external rotation    ?Knee flexion 134 115  ?Knee extension -4 -8  ?Ankle dorsiflexion    ?Ankle plantarflexion    ?Ankle inversion    ?Ankle eversion    ? (Blank rows = not tested) ? ?  ?Passive ROM Right ?07/27/2021 Left ?07/27/2021  ?Knee flexion 138 122  ?Knee extension -2 -5  ? ?LE MMT: ? ?MMT Right ?07/27/2021 Left ?07/27/2021  ?Hip flexion    ?Hip extension    ?Hip abduction    ?Hip adduction    ?Hip internal rotation    ?Hip external rotation    ?Knee flexion 5/5 4+/5  ?Knee extension 5/5 4+/5  ?Ankle dorsiflexion    ?Ankle plantarflexion    ?Ankle inversion    ?Ankle eversion    ? (Blank rows = not tested) ? ?LOWER EXTREMITY SPECIAL TESTS:  ?Knee special tests: Anterior drawer  test: negative ? ?FUNCTIONAL TESTS:  ?07/27/2021 : 5 times sit to stand: 22 with no UE support ? ?GAIT: ?Distance walked: 50 feet ?Assistive device utilized: None ?Level of assistance: Complete Independence ?Comments: mild increased knee flexion in stance on left LE ? ? ? ?TODAY'S TREATMENT: ?07/27/2021:  ?HEP instruction/performance c cues for techniques, handout provided.  Trial set performed of each for comprehension and symptom assessment.  See below for exercise list. ? ? ? ?PATIENT EDUCATION:  ?Education details: PT POC, HEP ?Person educated: Patient ?Education method: Explanation, Demonstration, Tactile cues, Verbal cues, and Handouts ?Education comprehension: verbalized understanding and returned demonstration ? ? ?HOME EXERCISE PROGRAM: ?Access Code: [email or web address] ?URL: [email or web address] ?Date: 07/27/2021 ?Prepared by: Narda Amber ? ?Exercises ?Hooklying Hamstring Stretch with Strap - 2 x daily - 7 x weekly - 3-5 reps - 30 seconds hold ?Seated Hamstring Stretch - 2 x daily - 7 x weekly - 3-5 reps - 30 seconds hold ?Supine Active Straight Leg Raise - 2 x daily - 7 x weekly - 2 sets - 10 reps ?Straight Leg Raise with External Rotation - 2 x daily - 7 x weekly - 2 sets - 10 reps ?Supine Bridge with Resistance Band - 2 x daily - 7 x weekly - 2 sets - 10 reps - 5 second hold ? ? ?ASSESSMENT: ? ?CLINICAL IMPRESSION: ?Patient is a 61 y.o. male who comes to clinic with complaints of left knee pain with mobility, strength and movement coordination deficits that impair their ability to perform usual daily and recreational functional activities without increase difficulty/symptoms at this time.  Pt stating he is trying to hold off on having surgery and needs to continue work until age 59. Patient to benefit from skilled PT services to address impairments and limitations to improve to previous level of function without restriction secondary to condition.  ? ? ?OBJECTIVE IMPAIRMENTS decreased activity  tolerance, decreased balance, decreased mobility, difficulty walking, decreased ROM, decreased strength, increased edema, and pain.  ? ?ACTIVITY LIMITATIONS community activity, occupation, and yard work.  ? ?PERSONAL FACTORS  OA in bilateral knees  are also affecting patient's functional outcome.  ? ? ?REHAB POTENTIAL: Excellent ? ?CLINICAL DECISION MAKING: Stable/uncomplicated ? ?EVALUATION COMPLEXITY: Low ? ? ?GOALS: ?Goals reviewed with patient? Yes ? ?Short term PT Goals (target date for Short term goals are 3 weeks 08/17/2021) ?Patient will demonstrate independent use of home exercise program to maintain progress from in clinic treatments. ?Goal status: New ?  ?Long term PT goals (target dates for all long term goals are 6 weeks  09/10/2021) ?Patient will demonstrate/report pain at worst less than or  equal to 2/10 to facilitate minimal limitation in daily activity secondary to pain symptoms. ?Goal status: New ? ?Patient will demonstrate independent use of home exercise program and progression to gym program to facilitate maximum functional gains.  ?Goal status: New ? ?Patient will demonstrate FOTO outcome > or = 61 % to indicate reduced disability due to condition. ?Goal status: New ? ?Pt will improve left knee AROM to 120 degrees.  ?Goal status: New ? ? ? ?PLAN: ?PT FREQUENCY:  1-3 more visits as needed to build HEP and gym program and follow up on pt's pain. ? ?PT DURATION: 6 weeks ? ?PLANNED INTERVENTIONS:  ?Therapeutic exercises, Therapeutic activity, Neuro Muscular re-education, Balance training, Gait training, Patient/Family education, Joint mobilization, Stair training, DME instructions, Dry Needling, Electrical stimulation, Cryotherapy, Moist heat, Taping, Ultrasound, Ionotophoresis 4mg /ml Dexamethasone, and Manual therapy.  All included unless contraindicated ? ?PLAN FOR NEXT SESSION: bike, LE strengthening, ROM, balance ? ? ? , PT, MPT ?07/27/2021, 4:03 PM ? ?

## 2021-08-11 ENCOUNTER — Encounter: Payer: Federal, State, Local not specified - PPO | Admitting: Physical Therapy

## 2021-08-25 ENCOUNTER — Ambulatory Visit: Payer: Federal, State, Local not specified - PPO | Admitting: Physical Therapy

## 2021-08-25 ENCOUNTER — Encounter: Payer: Self-pay | Admitting: Physical Therapy

## 2021-08-25 DIAGNOSIS — G8929 Other chronic pain: Secondary | ICD-10-CM

## 2021-08-25 DIAGNOSIS — R262 Difficulty in walking, not elsewhere classified: Secondary | ICD-10-CM

## 2021-08-25 DIAGNOSIS — M25562 Pain in left knee: Secondary | ICD-10-CM | POA: Diagnosis not present

## 2021-08-25 NOTE — Therapy (Addendum)
OUTPATIENT PHYSICAL THERAPY TREATMENT NOTE /DISCHARGE   Patient Name: Darius Solis MRN: [id number] DOB:Nov 15, 1960, 61 y.o., male Today's Date: 08/25/2021  PCP: [name], MD REFERRING PROVIDER: Meredith Pel, MD  END OF SESSION:   PT End of Session - 08/25/21 1040     Visit Number 2    Number of Visits 3    Date for PT Re-Evaluation 09/10/21    PT Start Time 1016    PT Stop Time 1107    PT Time Calculation (min) 51 min    Activity Tolerance Patient tolerated treatment well    Behavior During Therapy [facility] for tasks assessed/performed             Past Medical History:  Diagnosis Date   Allergy    History reviewed. No pertinent surgical history. Patient Active Problem List   Diagnosis Date Noted   Traumatic hematoma of hand 04/23/2020   ANORGASMIA, MALE 06/04/2008      THERAPY DIAG:  Chronic pain of left knee  Difficulty in walking, not elsewhere classified  PCP: [name], MD   REFERRING PROVIDER: Meredith Pel, MD   REFERRING DIAG: [phone] (ICD-10-CM) - Left knee pain, unspecified chronicity   ONSET DATE: Pt stating knee pain has been ongoing for years   SUBJECTIVE:    SUBJECTIVE STATEMENT: Pt stating knee is about the same   PERTINENT HISTORY: Left knee OA, allergies   PAIN:  Are you having pain? Yes: NPRS scale: 4/10 Pain location: left lateral knee along joint line Pain description: achy and progressing to throbbing  Aggravating factors: standing and walking at work 8 hour shifts on concrete floors Relieving factors: ice   PRECAUTIONS: None   WEIGHT BEARING RESTRICTIONS No   OCCUPATION: works at [employer] and work without pain     OBJECTIVE:    DIAGNOSTIC FINDINGS:  07/07/2021 AP, lateral, sunrise views of left knee reviewed.  Moderate degenerative  changes noted in the medial and lateral compartment with subchondral  sclerosis and osteophyte formation  as well as joint space narrowing.    Slight lateral subluxation of the tibia relative to the femur.  More  severe degenerative changes noted in the patellofemoral compartment with  subchondral sclerosis, joint space narrowing, osteophyte formation.  No  significant abnormal patellar height   PATIENT SURVEYS:  07/27/2021: FOTO 44% (predicted 61%)   EDEMA:  R knee circumference: 44.5 centimeters                 L knee circumference:  46 centimeters                 R thigh circumference: 56 centimeters                 L thigh circumference: 50 centimeters   MUSCLE LENGTH: Hamstrings: Right 70 deg; Left 65 deg   PALPATION: TTP; lateral joint line,    LE ROM:   Active ROM Right 07/27/2021 Left 07/27/2021  Hip flexion      Hip extension      Hip abduction      Hip adduction      Hip internal rotation      Hip external rotation      Knee flexion 134 115  Knee extension -4 -8  Ankle dorsiflexion      Ankle plantarflexion      Ankle inversion      Ankle eversion       (  Blank rows = not tested)              Passive ROM Right 07/27/2021 Left 07/27/2021  Knee flexion 138 122  Knee extension -2 -5    LE MMT:   MMT Right 07/27/2021 Left 07/27/2021  Hip flexion      Hip extension      Hip abduction      Hip adduction      Hip internal rotation      Hip external rotation      Knee flexion 5/5 4+/5  Knee extension 5/5 4+/5  Ankle dorsiflexion      Ankle plantarflexion      Ankle inversion      Ankle eversion       (Blank rows = not tested)   LOWER EXTREMITY SPECIAL TESTS:  Knee special tests: Anterior drawer test: negative   FUNCTIONAL TESTS:  07/27/2021 : 5 times sit to stand: 22 with no UE support   GAIT: Distance walked: 50 feet Assistive device utilized: None Level of assistance: Complete Independence Comments: mild increased knee flexion in stance on left LE       TODAY'S TREATMENT: 08/25/21 Bike L5 X 8 min Slantboard stretch 30 sec X2 Leg press machine  DL 100# 2X10 with eccentrics, then Lt leg 50# 2X10 eccentrics Hamstring curl machine 55# DL 2X10 eccentics Leg extension machine up with both down with left eccentrics 10# 2X10 TRX squats 2X10 eccentrics TRX lunges X 10 bilat eccentrics Vasopnuematic Lt knee X 10 min 34 deg medium compression  07/27/2021:  HEP instruction/performance c cues for techniques, handout provided.  Trial set performed of each for comprehension and symptom assessment.  See below for exercise list.       PATIENT EDUCATION:  Education details: PT POC, HEP Person educated: Patient Education method: Explanation, Demonstration, Tactile cues, Verbal cues, and Handouts Education comprehension: verbalized understanding and returned demonstration     HOME EXERCISE PROGRAM: Access Code: [email or web address] URL: [email or web address] Date: 08/25/2021 Prepared by: Elsie Ra  Exercises - Seated Hamstring Stretch  - 2 x daily - 7 x weekly - 3-5 reps - 30 seconds hold - Straight Leg Raise with External Rotation  - 2 x daily - 7 x weekly - 2 sets - 10 reps - Supine Bridge with Resistance Band  - 2 x daily - 7 x weekly - 2 sets - 10 reps - 5 second hold - Eccentric Leg Press  - 2 x daily - 6 x weekly - 2-3 sets - 10 reps - Single Leg Knee Extension with Weight Machine  - 2 x daily - 6 x weekly - 2-3 sets - 10 reps - Eccentric Hamstring Curl with Weight Machine  - 2 x daily - 6 x weekly - 2-3 sets - 10 reps - Assisted Lunge with TRX  - 2 x daily - 6 x weekly - 1 sets - 10 reps - Squat with TRX  - 2 x daily - 6 x weekly - 2 sets - 10 reps - Step Up  - 2 x daily - 6 x weekly - 1-2 sets - 10 reps - Lateral Step Up  - 2 x daily - 6 x weekly - 1-2 sets - 10 reps     ASSESSMENT:   CLINICAL IMPRESSION: Session focused on reviewing gym activities and progressing home program. He had good overall tolerance to this and I printed out new copy of these gym activities. We did use vasopnuematic at end to reduce edema in  his  left knee. He does not have another visit set up and he wants to trial his independent HEP and gym program and will plan to call back in 2 weeks to set up another appointment if needed.      OBJECTIVE IMPAIRMENTS decreased activity tolerance, decreased balance, decreased mobility, difficulty walking, decreased ROM, decreased strength, increased edema, and pain.    ACTIVITY LIMITATIONS community activity, occupation, and yard work.    PERSONAL FACTORS  OA in bilateral knees  are also affecting patient's functional outcome.      REHAB POTENTIAL: Excellent   CLINICAL DECISION MAKING: Stable/uncomplicated   EVALUATION COMPLEXITY: Low     GOALS: Goals reviewed with patient? Yes   Short term PT Goals (target date for Short term goals are 3 weeks 08/17/2021) Patient will demonstrate independent use of home exercise program to maintain progress from in clinic treatments. Goal status: New   Long term PT goals (target dates for all long term goals are 6 weeks  09/10/2021) Patient will demonstrate/report pain at worst less than or equal to 2/10 to facilitate minimal limitation in daily activity secondary to pain symptoms. Goal status: New   Patient will demonstrate independent use of home exercise program and progression to gym program to facilitate maximum functional gains.  Goal status: New   Patient will demonstrate FOTO outcome > or = 61 % to indicate reduced disability due to condition. Goal status: New   Pt will improve left knee AROM to 120 degrees.  Goal status: New       PLAN: PT FREQUENCY:  1-3 more visits as needed to build HEP and gym program and follow up on pt's pain.   PT DURATION: 6 weeks   PLANNED INTERVENTIONS:  Therapeutic exercises, Therapeutic activity, Neuro Muscular re-education, Balance training, Gait training, Patient/Family education, Joint mobilization, Stair training, DME instructions, Dry Needling, Electrical stimulation, Cryotherapy, Moist heat, Taping,  Ultrasound, Ionotophoresis 4mg /ml Dexamethasone, and Manual therapy.  All included unless contraindicated   PLAN FOR NEXT SESSION: how is independent gym program, we will DC in 30 days if he has not returned   Debbe Odea, PT,DPT 08/25/2021, 11:05 AM  PHYSICAL THERAPY DISCHARGE SUMMARY  Visits from Start of Care: 2  Current functional level related to goals / functional outcomes: See note   Remaining deficits: See note   Education / Equipment: HEP   Patient goals were partially met. Patient is being discharged due to not returning since the last visit.  Scot Jun, PT, DPT, OCS, ATC 10/28/21  2:15 PM

## 2021-12-27 ENCOUNTER — Other Ambulatory Visit: Payer: Self-pay | Admitting: Family Medicine

## 2021-12-28 NOTE — Telephone Encounter (Signed)
Last filled om 11/02/20 # 50 Last ov 03/18/21  Nxt ov none

## 2022-02-17 ENCOUNTER — Encounter: Payer: Self-pay | Admitting: Family Medicine

## 2022-02-17 ENCOUNTER — Ambulatory Visit: Payer: Federal, State, Local not specified - PPO | Admitting: Family Medicine

## 2022-02-17 VITALS — BP 120/70 | HR 76 | Temp 98.2°F | Ht 72.25 in | Wt 234.1 lb

## 2022-02-17 DIAGNOSIS — S8991XA Unspecified injury of right lower leg, initial encounter: Secondary | ICD-10-CM

## 2022-02-17 DIAGNOSIS — Z23 Encounter for immunization: Secondary | ICD-10-CM | POA: Diagnosis not present

## 2022-02-17 DIAGNOSIS — M25461 Effusion, right knee: Secondary | ICD-10-CM | POA: Diagnosis not present

## 2022-02-17 MED ORDER — SILDENAFIL CITRATE 20 MG PO TABS: ORAL_TABLET | ORAL | 5 refills | Status: DC

## 2022-02-17 MED ORDER — TRIAMCINOLONE ACETONIDE 40 MG/ML IJ SUSP
40.0000 mg | Freq: Once | INTRAMUSCULAR | Status: AC
  Administered 2022-02-17: 40 mg via INTRA_ARTICULAR

## 2022-02-17 NOTE — Progress Notes (Signed)
Xylah Early T. Salaya Holtrop, MD, CAQ Sports Medicine [facility] [address] [place], [address]  Phone: [phone]  FAX: [phone]  Darius Solis - 61 y.o. male  MRN [id number]  Date of Birth: 01-15-1961  Date: 02/17/2022  PCP: Hannah Beat, MD  Referral: Hannah Beat, MD  Chief Complaint  Patient presents with   Knee Pain    Right-Twisted knee in work shed yesterday   Subjective:   Darius Solis is a 61 y.o. very pleasant male patient with Body mass index is 31.53 kg/m. who presents with the following:  Very pleasant well-known gentleman who presents with a right-sided knee acute injury.  At this point he is having quite a bit of difficulty and pain in his knee and a large effusion.  Right now he is having a lot of difficulty flexing at the knee and having pain with any kind of terminal motion.  He was trying to clean up his shed and he was grabbing something and then missed a step and twisted his knee.  He had some immediate pain, and since then he has been having a difficult time.  He does have a history of some left-sided knee problems, but the right knee has always been his healthy knee.  No prior history of major trauma or operation.  Cannot really.  Bend his knee.   Was [place] something off of his shed, and then missed a step and twisted knee.   Asp 60 cc  Review of Systems is noted in the HPI, as appropriate  Objective:   BP 120/70   Pulse 76   Temp 98.2 F (36.8 C) (Oral)   Ht 6' 0.25" (1.835 m)   Wt 234 lb 2 oz (106.2 kg)   SpO2 97%   BMI 31.53 kg/m   GEN: No acute distress; alert,appropriate. PULM: Breathing comfortably in no respiratory distress PSYCH: Normally interactive.   Right knee: Full extension.  Flexion to 105.  Large ballotable effusion.  Stable to varus and valgus stress.  Lachman is negative.  Drawer testing is negative.  Any kind of forced flexion including McMurray's causes pain, but  there are no mechanical symptoms.  Patellar tendon is intact and nontender with manipulation of the patella.  He does have some medial joint line tenderness.  Laboratory and Imaging Data:  Assessment and Plan:     ICD-10-CM   1. Knee injury, right, initial encounter  S89.91XA triamcinolone acetonide (KENALOG-40) injection 40 mg    2. Need for shingles vaccine  Z23 Zoster Recombinant (Shingrix )    3. Knee effusion, right  M25.461      Acute knee injury with internal derangement of the right knee.  Exact pathology is not 100% clear, but he certainly is injured his knee without obvious ligamentous disruption.  He has a large effusion, so we will aspirate this today for symptomatic relief.  He also is going to keep wearing a knee brace.  Recommended that he ice his knee regularly and profusely over the next few days, 20 minutes on, 20 minutes off.  Aspiration/Injection Procedure Note Darius Solis [birth year] Date of procedure: 02/17/2022  Procedure: Large Joint Aspiration / Injection with synovial fluid aspiration of knee, R Indications: Pain  Procedure Details Patient verbally consented; risks, benefits, and alternatives explained. Patient prepped with Chloraprep. Ethyl chloride for anesthesia. 10 cc of 1% Lidocaine used in wheal then injected Subcutaneous fashion with 22 gauge needle on lateral approach. Under sterilne conditions, 18 gauge  needle used via lateral approach to aspirate 60 cc of yellowish and serosanguineous fluid. Then 9 cc of Lidocaine 1% and 1 mL of Kenalog 40 mg injected. Tolerated well, decreased pain, no complications. Medication: 1 mL of Kenalog 40 mg   Medication Management during today's office visit: Meds ordered this encounter  Medications   sildenafil (REVATIO) 20 MG tablet    Sig: TAKE 2-5 TABLETS BY MOUTH  30 MINUTES PRIOR TO INTERCOURSE AS DIRECTED    Dispense:  50 tablet    Refill:  5   triamcinolone acetonide (KENALOG-40) injection 40 mg    Medications Discontinued During This Encounter  Medication Reason   doxycycline (VIBRA-TABS) 100 MG tablet Completed Course   celecoxib (CELEBREX) 200 MG capsule Completed Course   sildenafil (REVATIO) 20 MG tablet Reorder    Orders placed today for conditions managed today: Orders Placed This Encounter  Procedures   Zoster Recombinant (Shingrix )    Disposition: No follow-ups on file.  Dr[employer] One speech-to-text software was used for transcription in this dictation.  Possible transcriptional errors can occur using Editor, commissioning.   Signed,  Maud Deed. Bobbyjo Marulanda, MD   Outpatient Encounter Medications as of 02/17/2022  Medication Sig   [DISCONTINUED] sildenafil (REVATIO) 20 MG tablet TAKE 2-5 TABLETS BY MOUTH  30 MINUTES PRIOR TO INTERCOURSE AS DIRECTED   sildenafil (REVATIO) 20 MG tablet TAKE 2-5 TABLETS BY MOUTH  30 MINUTES PRIOR TO INTERCOURSE AS DIRECTED   [DISCONTINUED] celecoxib (CELEBREX) 200 MG capsule Take 200 mg by mouth daily.   [DISCONTINUED] doxycycline (VIBRA-TABS) 100 MG tablet Take 1 tablet (100 mg total) by mouth 2 (two) times daily.   [EXPIRED] triamcinolone acetonide (KENALOG-40) injection 40 mg    No facility-administered encounter medications on file as of 02/17/2022.

## 2022-02-23 ENCOUNTER — Other Ambulatory Visit: Payer: Self-pay | Admitting: Family Medicine

## 2022-03-07 ENCOUNTER — Other Ambulatory Visit: Payer: Self-pay | Admitting: Family Medicine

## 2022-03-14 IMAGING — DX DG SHOULDER 2+V*R*
3 series · 3 of 3 positions shown · non-contrast
Comparison: None.

CLINICAL DATA: Pain following recent injury

EXAM:
RIGHT SHOULDER - 2+ VIEW

[shoulder axial]
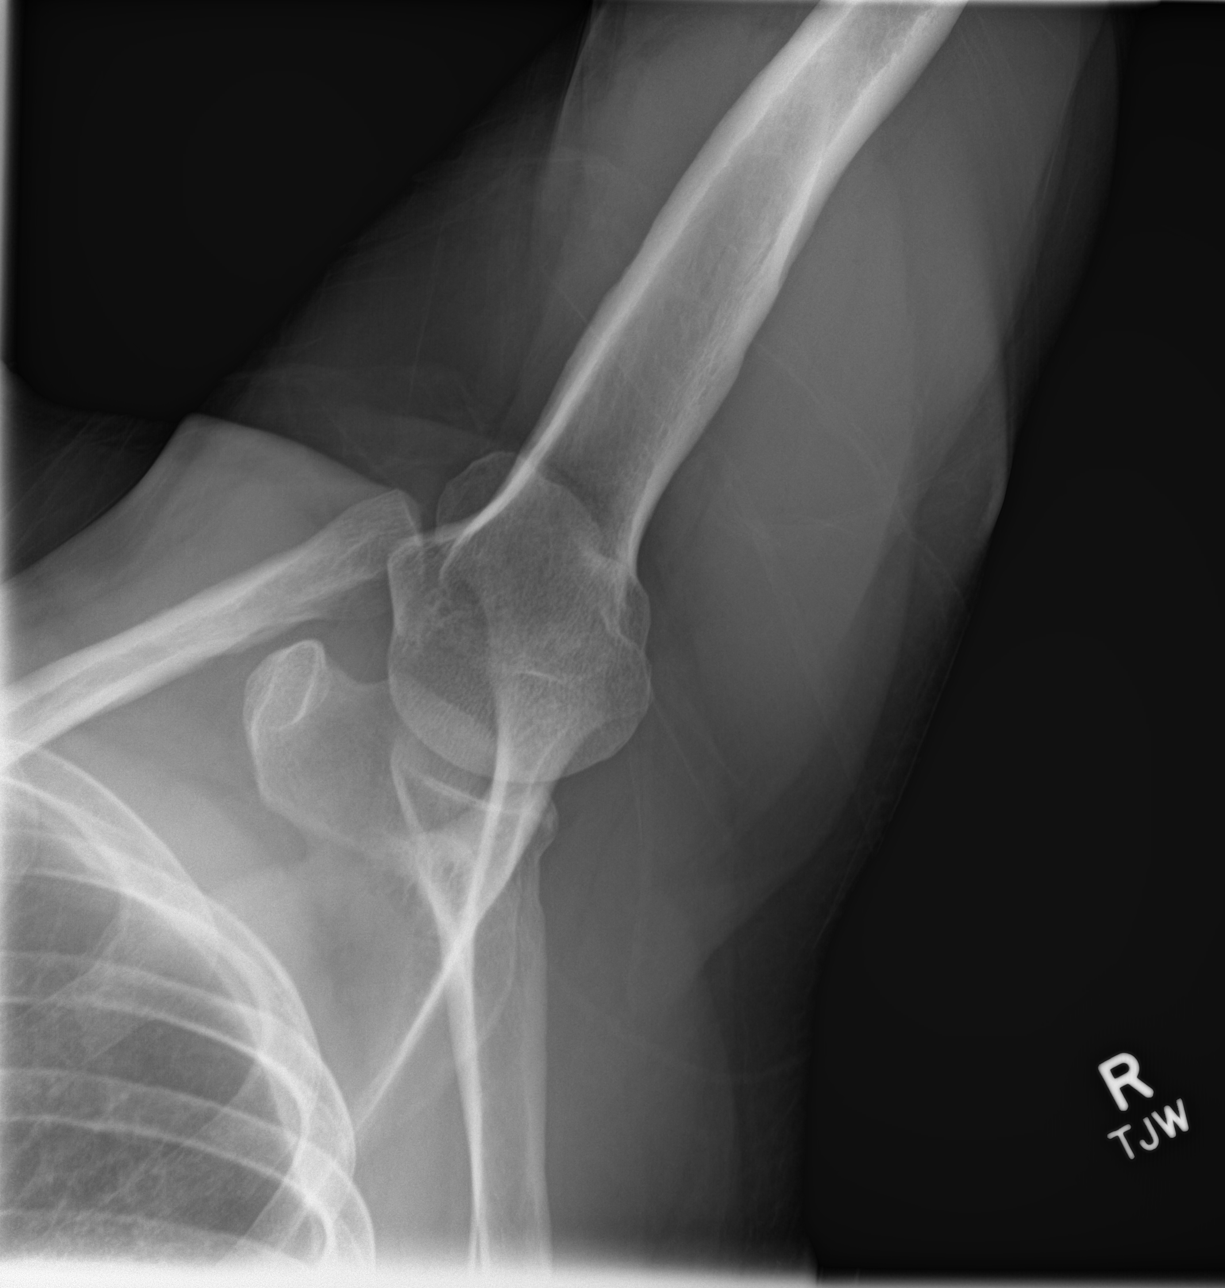

[shoulder ap]
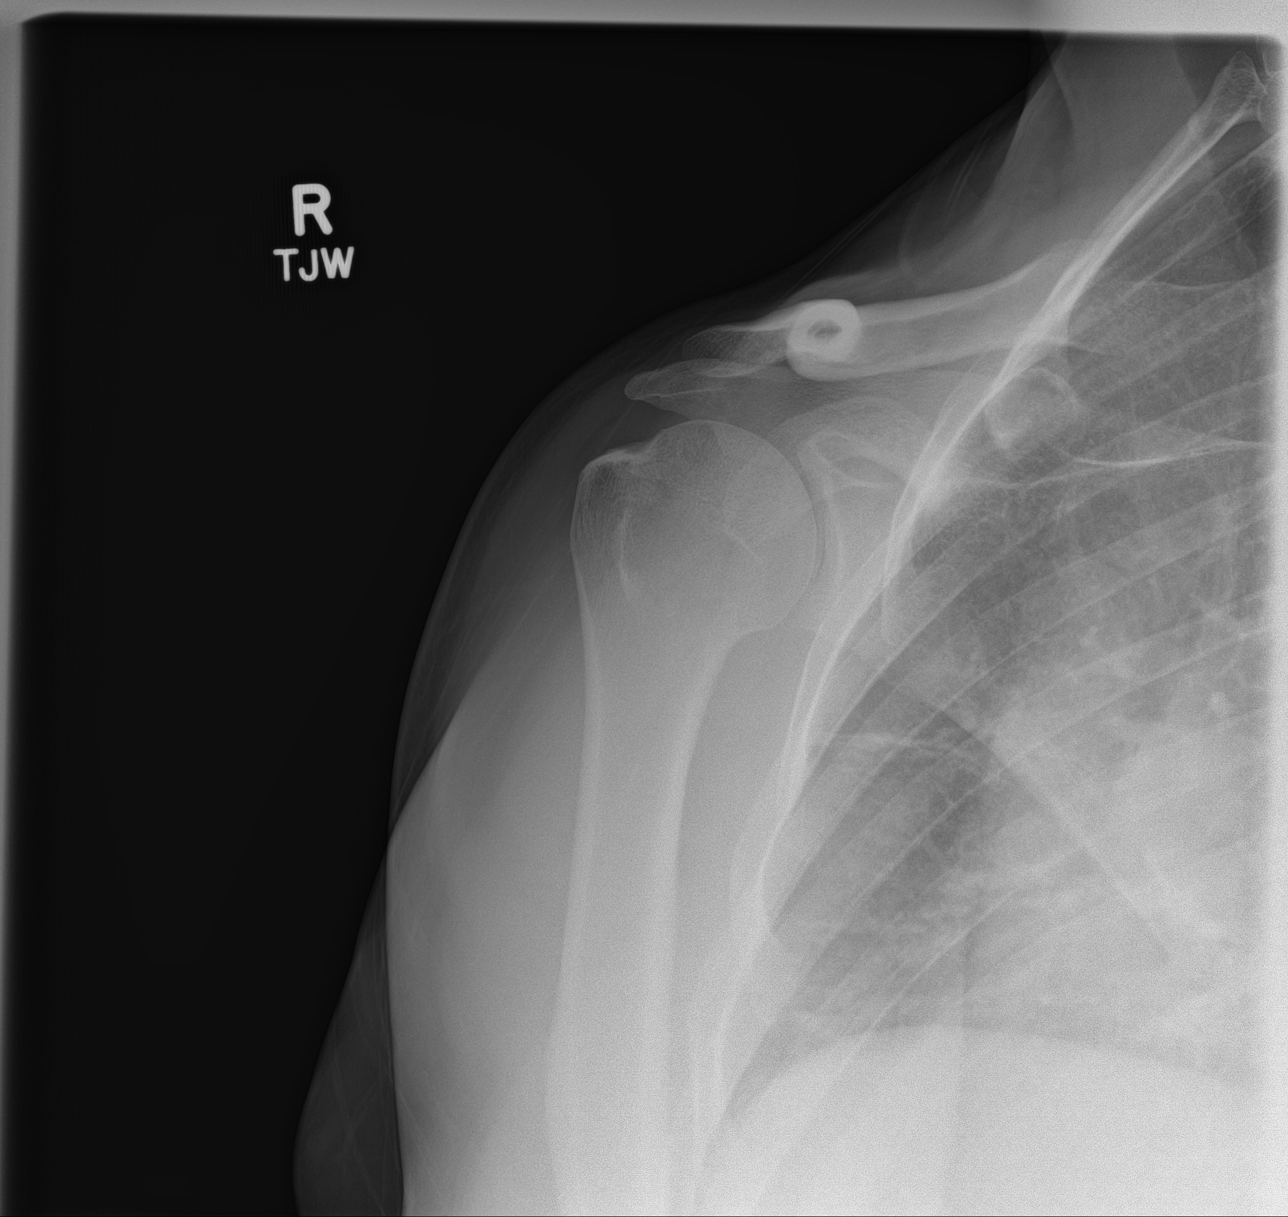

[shoulder y-view]
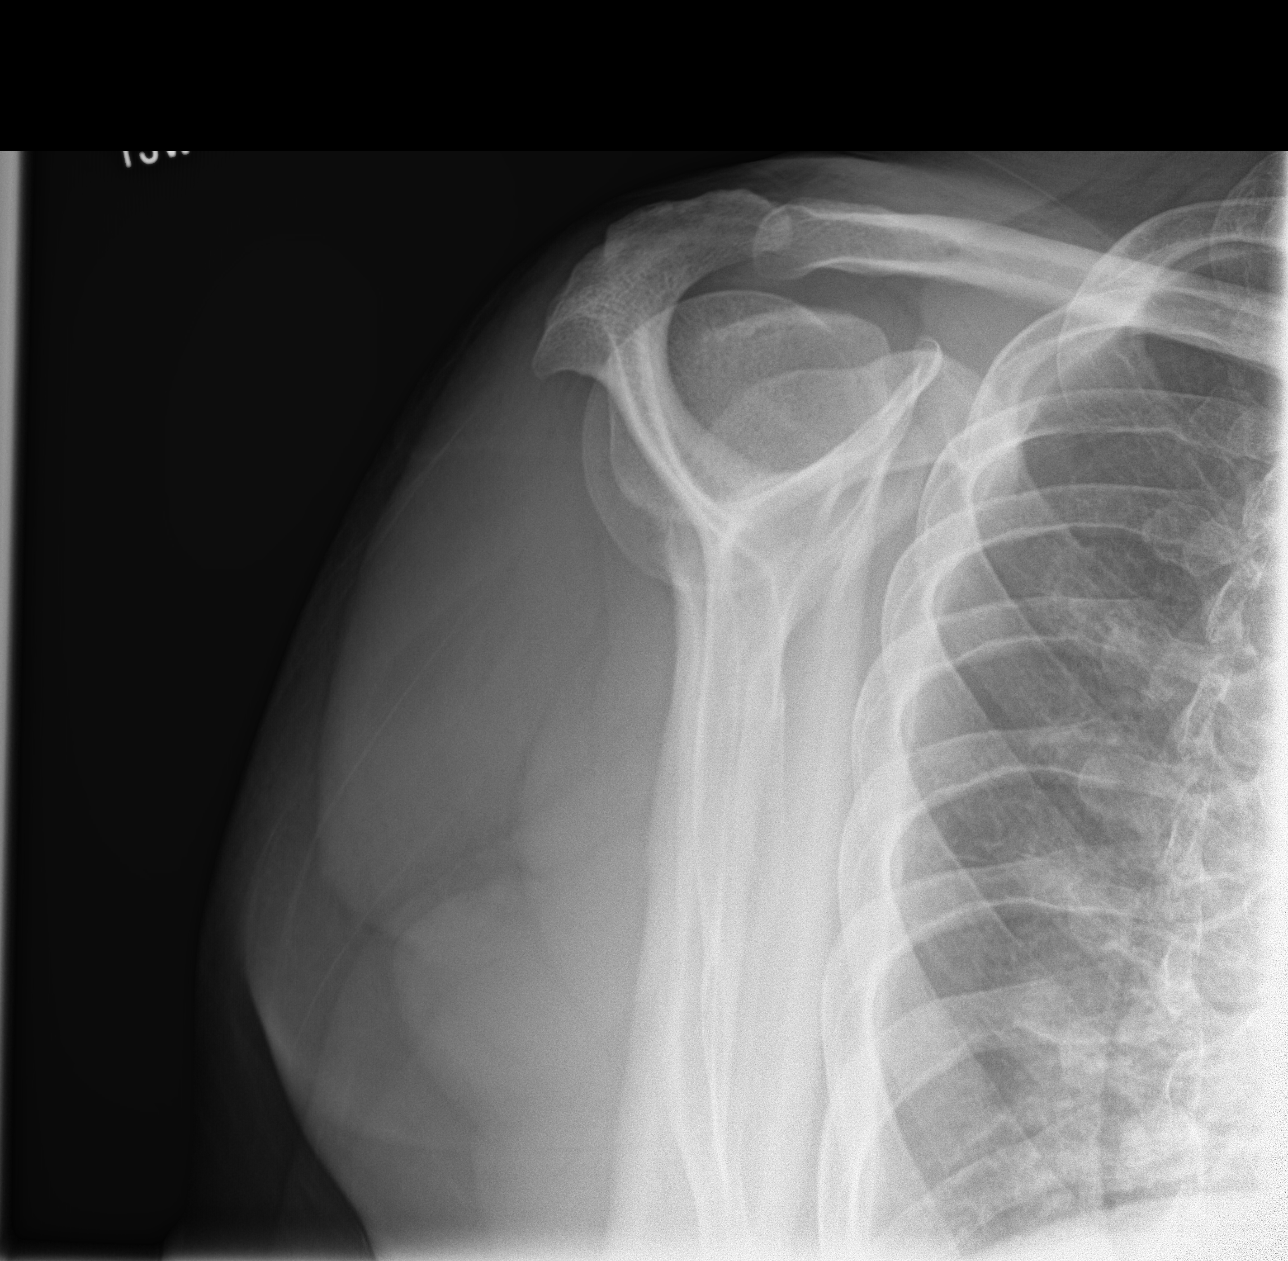

[3 of 3 positions shown; findings below may reference images not displayed]

FINDINGS: Oblique, Y scapular, and axillary images were obtained. No
appreciable fracture or dislocation. Joint spaces appear
unremarkable. No erosive change. Visualized right lung clear.
IMPRESSION: No fracture or dislocation.  No appreciable arthropathic change.

## 2022-04-05 ENCOUNTER — Encounter: Payer: Self-pay | Admitting: Family Medicine

## 2022-07-25 ENCOUNTER — Ambulatory Visit: Payer: Federal, State, Local not specified - PPO | Admitting: Family Medicine

## 2022-07-25 ENCOUNTER — Encounter: Payer: Self-pay | Admitting: Family Medicine

## 2022-07-25 VITALS — BP 110/70 | HR 74 | Temp 98.0°F | Ht 72.25 in | Wt 238.2 lb

## 2022-07-25 DIAGNOSIS — Z131 Encounter for screening for diabetes mellitus: Secondary | ICD-10-CM | POA: Diagnosis not present

## 2022-07-25 DIAGNOSIS — R5383 Other fatigue: Secondary | ICD-10-CM | POA: Diagnosis not present

## 2022-07-25 DIAGNOSIS — Z13220 Encounter for screening for lipoid disorders: Secondary | ICD-10-CM | POA: Diagnosis not present

## 2022-07-25 DIAGNOSIS — Z0000 Encounter for general adult medical examination without abnormal findings: Secondary | ICD-10-CM

## 2022-07-25 DIAGNOSIS — Z125 Encounter for screening for malignant neoplasm of prostate: Secondary | ICD-10-CM

## 2022-07-25 DIAGNOSIS — Z23 Encounter for immunization: Secondary | ICD-10-CM | POA: Diagnosis not present

## 2022-07-25 NOTE — Progress Notes (Unsigned)
Darius Horrigan T. Kaniah [name], MD, [facility] [place], [address]  Phone: [phone]  FAX: [phone]  Darius Solis - 62 y.o. male  MRN [id number]  Date of Birth: 08-21-1960  Date: 07/25/2022  PCP: [name], MD  Referral: [name], MD  Healthcare Maintenance  Patient Care Team: [name], MD as PCP - General Subjective:   Darius Solis is a 62 y.o. pleasant patient who presents with the following:  Preventative Health Maintenance Visit:  Health Maintenance Summary Reviewed and updated, unless pt declines services.  Tobacco History Reviewed. Alcohol: No concerns, no excessive use - none Exercise Habits: Some activity, rec at least 30 mins 5 times a week STD concerns: no risk or activity to increase risk Drug Use: None  Under the head of his penis - will get dry.  - dry skin occ.   Has been going to PT - for his knee.  Has been having knee pain - so far so good.   Walks his dog.  Has not been to the gym lately.   Working six a day a week.  60+ hours a day  Covid booster - will think on it  Health Maintenance  Topic Date Due   COVID-19 Vaccine (3 - 2023-24 season) 01/14/2022   Zoster Vaccines- Shingrix (2 of 2) 04/14/2022   INFLUENZA VACCINE  08/14/2022 (Originally 12/14/2021)   COLONOSCOPY (Pts 45-55yr Insurance coverage will need to be confirmed)  03/07/2024   DTaP/Tdap/Td (3 - Tdap) 04/23/2030   Hepatitis C Screening  Completed   HIV Screening  Completed   HPV VACCINES  Aged Out   Immunization History  Administered Date(s) Administered   Influenza Split 03/02/2011   Influenza,inj,Quad PF,6+ Mos 04/24/2013, 04/27/2016, 01/24/2017   PFIZER(Purple Top)SARS-COV-2 Vaccination 07/25/2019, 08/22/2019   Td 07/09/2009, 04/23/2020   Zoster Recombinat (Shingrix) 02/17/2022   Patient Active Problem List   Diagnosis Date Noted   Traumatic hematoma of hand 04/23/2020    ANORGASMIA, MALE 06/04/2008    Past Medical History:  Diagnosis Date   Allergy     History reviewed. No pertinent surgical history.  History reviewed. No pertinent family history.  Social History   Social History Narrative   Not on file    Past Medical History, Surgical History, Social History, Family History, Problem List, Medications, and Allergies have been reviewed and updated if relevant.  Review of Systems: Pertinent positives are listed above.  Otherwise, a full 14 point review of systems has been done in full and it is negative except where it is noted positive.  Objective:   BP 110/70   Pulse 74   Temp 98 F (36.7 C) (Temporal)   Ht 6' 0.25" (1.835 m)   Wt 238 lb 4 oz (108.1 kg)   SpO2 98%   BMI 32.09 kg/m  Ideal Body Weight: Weight in (lb) to have BMI = 25: 185.2  Ideal Body Weight: Weight in (lb) to have BMI = 25: 185.2 No results found.    07/25/2022    3:58 PM 12/20/2017    2:30 PM  Depression screen PHQ 2/9  Decreased Interest 0 0  Down, Depressed, Hopeless 0 0  PHQ - 2 Score 0 0     GEN: well developed, well nourished, no acute distress Eyes: conjunctiva and lids normal, PERRLA, EOMI ENT: TM clear, nares clear, oral exam WNL Neck: supple, no lymphadenopathy, no thyromegaly, no JVD Pulm: clear to auscultation and percussion, respiratory effort  normal CV: regular rate and rhythm, S1-S2, no murmur, rub or gallop, no bruits, peripheral pulses normal and symmetric, no cyanosis, clubbing, edema or varicosities GI: soft, non-tender; no hepatosplenomegaly, masses; active bowel sounds all quadrants GU: deferred Lymph: no cervical, axillary or inguinal adenopathy MSK: gait normal, muscle tone and strength WNL, no joint swelling, effusions, discoloration, crepitus  SKIN: clear, good turgor, color WNL, no rashes, lesions, or ulcerations Neuro: normal mental status, normal strength, sensation, and motion Psych: alert; oriented to person, place and time,  normally interactive and not anxious or depressed in appearance.  All labs reviewed with patient. Results for orders placed or performed in visit on 11/04/20  Testos,Total,Free and SHBG (Male)  Result Value Ref Range   Testosterone, Total, LC-MS-MS 514 250 - 1,100 ng/dL   Free Testosterone 63.7 35.0 - 155.0 pg/mL   Sex Hormone Binding 57 22 - 77 nmol/L  Hemoglobin A1c  Result Value Ref Range   Hgb A1c MFr Bld 4.8 4.6 - 6.5 %  PSA, Total with Reflex to PSA, Free  Result Value Ref Range   PSA, Total 0.5 < OR = 4.0 ng/mL  Lipid panel  Result Value Ref Range   Cholesterol 196 0 - 200 mg/dL   Triglycerides 60.0 0.0 - 149.0 mg/dL   HDL 56.40 >39.00 mg/dL   VLDL 12.0 0.0 - 40.0 mg/dL   LDL Cholesterol 128 (H) 0 - 99 mg/dL   Total CHOL/HDL Ratio 3    NonHDL 139.69   Hepatic function panel  Result Value Ref Range   Total Bilirubin 0.9 0.2 - 1.2 mg/dL   Bilirubin, Direct 0.1 0.0 - 0.3 mg/dL   Alkaline Phosphatase 40 39 - 117 U/L   AST 18 0 - 37 U/L   ALT 16 0 - 53 U/L   Total Protein 6.9 6.0 - 8.3 g/dL   Albumin 4.2 3.5 - 5.2 g/dL  CBC with Differential/Platelet  Result Value Ref Range   WBC 4.7 4.0 - 10.5 K/uL   RBC 3.96 (L) 4.22 - 5.81 Mil/uL   Hemoglobin 12.9 (L) 13.0 - 17.0 g/dL   HCT 38.3 (L) 39.0 - 52.0 %   MCV 96.8 78.0 - 100.0 fl   MCHC 33.7 30.0 - 36.0 g/dL   RDW 13.3 11.5 - 15.5 %   Platelets 238.0 150.0 - 400.0 K/uL   Neutrophils Relative % 54.6 43.0 - 77.0 %   Lymphocytes Relative 33.0 12.0 - 46.0 %   Monocytes Relative 9.8 3.0 - 12.0 %   Eosinophils Relative 1.8 0.0 - 5.0 %   Basophils Relative 0.8 0.0 - 3.0 %   Neutro Abs 2.6 1.4 - 7.7 K/uL   Lymphs Abs 1.6 0.7 - 4.0 K/uL   Monocytes Absolute 0.5 0.1 - 1.0 K/uL   Eosinophils Absolute 0.1 0.0 - 0.7 K/uL   Basophils Absolute 0.0 0.0 - 0.1 K/uL  Basic metabolic panel  Result Value Ref Range   Sodium 140 135 - 145 mEq/L   Potassium 4.2 3.5 - 5.1 mEq/L   Chloride 104 96 - 112 mEq/L   CO2 30 19 - 32 mEq/L    Glucose, Bld 87 70 - 99 mg/dL   BUN 14 6 - 23 mg/dL   Creatinine, Ser 0.97 0.40 - 1.50 mg/dL   GFR 86.35 >60.00 mL/min   Calcium 9.6 8.4 - 10.5 mg/dL    Assessment and Plan:     ICD-10-CM   1. Healthcare maintenance  Z00.00       Health Maintenance Exam:  The patient's preventative maintenance and recommended screening tests for an annual wellness exam were reviewed in full today. Brought up to date unless services declined.  Counselled on the importance of diet, exercise, and its role in overall health and mortality. The patient's FH and SH was reviewed, including their home life, tobacco status, and drug and alcohol status.  Follow-up in 1 year for physical exam or additional follow-up below.  Disposition: No follow-ups on file.  No orders of the defined types were placed in this encounter.  There are no discontinued medications. No orders of the defined types were placed in this encounter.   Signed,  Maud Deed. Dimond Crotty, MD   Allergies as of 07/25/2022   No Known Allergies      Medication List        Accurate as of July 25, 2022  4:11 PM. If you have any questions, ask your nurse or doctor.          sildenafil 20 MG tablet Commonly known as: REVATIO TAKE 2 TO 5 TABLETS BY MOUTH 30 MINUTES PRIOR TO INTERCOURSE AS DIRECTED

## 2022-07-25 NOTE — Patient Instructions (Signed)
Supplements: Tart cherry juice and Curcumin (Turmeric extract) have good scientific evidence  - get the concentrated capsules or gelcaps over the counter so you do not get the calories from the juice.

## 2022-07-26 LAB — BASIC METABOLIC PANEL
BUN: 19 mg/dL (ref 6–23)
CO2: 25 mEq/L (ref 19–32)
Calcium: 9.9 mg/dL (ref 8.4–10.5)
Chloride: 103 mEq/L (ref 96–112)
Creatinine, Ser: 1.03 mg/dL (ref 0.40–1.50)
GFR: 79.39 mL/min (ref >60.00)
Glucose, Bld: 91 mg/dL (ref 70–99)
Potassium: 4.2 mEq/L (ref 3.5–5.1)
Sodium: 137 mEq/L (ref 135–145)

## 2022-07-26 LAB — LIPID PANEL
Cholesterol: 199 mg/dL (ref 0–200)
HDL: 49.9 mg/dL (ref >39.00)
LDL Cholesterol: 116 mg/dL — ABNORMAL HIGH (ref 0–99)
NonHDL: 148.98
Total CHOL/HDL Ratio: 4
Triglycerides: 166 mg/dL — ABNORMAL HIGH (ref 0.0–149.0)
VLDL: 33.2 mg/dL (ref 0.0–40.0)

## 2022-07-26 LAB — CBC WITH DIFFERENTIAL/PLATELET
Basophils Absolute: 0.1 10*3/uL (ref 0.0–0.1)
Basophils Relative: 1.3 % (ref 0.0–3.0)
Eosinophils Absolute: 0.1 10*3/uL (ref 0.0–0.7)
Eosinophils Relative: 1.9 % (ref 0.0–5.0)
HCT: 40.7 % (ref 39.0–52.0)
Hemoglobin: 13.7 g/dL (ref 13.0–17.0)
Lymphocytes Relative: 32.5 % (ref 12.0–46.0)
Lymphs Abs: 2.1 10*3/uL (ref 0.7–4.0)
MCHC: 33.7 g/dL (ref 30.0–36.0)
MCV: 96 fl (ref 78.0–100.0)
Monocytes Absolute: 0.5 10*3/uL (ref 0.1–1.0)
Monocytes Relative: 8 % (ref 3.0–12.0)
Neutro Abs: 3.6 10*3/uL (ref 1.4–7.7)
Neutrophils Relative %: 56.3 % (ref 43.0–77.0)
Platelets: 262 10*3/uL (ref 150.0–400.0)
RBC: 4.24 Mil/uL (ref 4.22–5.81)
RDW: 13 % (ref 11.5–15.5)
WBC: 6.4 10*3/uL (ref 4.0–10.5)

## 2022-07-26 LAB — HEPATIC FUNCTION PANEL
ALT: 24 U/L (ref 0–53)
AST: 22 U/L (ref 0–37)
Albumin: 4.2 g/dL (ref 3.5–5.2)
Alkaline Phosphatase: 40 U/L (ref 39–117)
Bilirubin, Direct: 0.1 mg/dL (ref 0.0–0.3)
Total Bilirubin: 0.5 mg/dL (ref 0.2–1.2)
Total Protein: 7.1 g/dL (ref 6.0–8.3)

## 2022-07-26 LAB — HEMOGLOBIN A1C: Hgb A1c MFr Bld: 4.9 % (ref 4.6–6.5)

## 2022-07-26 LAB — PSA, TOTAL WITH REFLEX TO PSA, FREE: PSA, Total: 0.7 ng/mL (ref <=4.0)

## 2022-08-23 ENCOUNTER — Ambulatory Visit: Payer: Federal, State, Local not specified - PPO | Admitting: Family Medicine

## 2022-08-23 ENCOUNTER — Encounter: Payer: Self-pay | Admitting: Family Medicine

## 2022-08-23 VITALS — BP 110/64 | HR 74 | Temp 97.4°F | Ht 72.25 in | Wt 235.0 lb

## 2022-08-23 DIAGNOSIS — J309 Allergic rhinitis, unspecified: Secondary | ICD-10-CM | POA: Insufficient documentation

## 2022-08-23 MED ORDER — GUAIFENESIN-CODEINE 100-10 MG/5ML PO SYRP: 5.0000 mL | ORAL_SOLUTION | Freq: Every evening | ORAL | 0 refills | Status: DC | PRN

## 2022-08-23 NOTE — Progress Notes (Signed)
Patient ID: Darius Solis, male    DOB: [birth year], 62 y.o.   MRN: [id number]  This visit was conducted in person.  BP 110/64   Pulse 74   Temp (!) 97.4 F (36.3 C) (Temporal)   Ht 6' 0.25" (1.835 m)   Wt 235 lb (106.6 kg)   SpO2 97%   BMI 31.65 kg/m    CC:  Chief Complaint  Patient presents with   Cough    Dry x 2 weeks   Nasal Congestion    Subjective:   HPI: Darius Solis is a 62 y.o. male pt of Dr. Cyndie Chime  w doing Flonase nasal spray to 2 sprays per nostril so spray spray and spray spray that goes up the steroid it goes directly on those turbinates where you have all this swelling and reaction and mucus production pressure and that decreases all that so should help that feel better so the Xyzal at bedtime cough suppressant to help you kind of rest bloating but I will refill you can you can you can buy already has weird ones that it started with an ex follow.  They are trying to be cute ith history of allergies.presenting on 08/23/2022 for Cough (Dry x 2 weeks) and Nasal Congestion   Date of onset:  2 weeks Initial symptoms included  nasal congestion and dry cough Symptoms progressed to chest congestion.  NO SOB, no wheeze.  NO fever, no ear pain,  has sinus pressure.  Cannot rest at night.   Sick contacts:  none.. works at [employer] COVID testing:   none    He has tried to treat with  benadryl, tussin, alka seltzer flu     No history of chronic lung disease such as asthma or COPD.  Former smoker.        Relevant past medical, surgical, family and social history reviewed and updated as indicated. Interim medical history since our last visit reviewed. Allergies and medications reviewed and updated. Outpatient Medications Prior to Visit  Medication Sig Dispense Refill   sildenafil (REVATIO) 20 MG tablet TAKE 2 TO 5 TABLETS BY MOUTH 30 MINUTES PRIOR TO INTERCOURSE AS DIRECTED 50 tablet 5   No facility-administered medications prior to visit.     Per  HPI unless specifically indicated in ROS section below Review of Systems  Constitutional:  Negative for fatigue and fever.  HENT:  Positive for postnasal drip, sinus pressure and sneezing. Negative for ear pain.   Eyes:  Negative for pain.  Respiratory:  Positive for cough. Negative for shortness of breath.   Cardiovascular:  Negative for chest pain, palpitations and leg swelling.  Gastrointestinal:  Negative for abdominal pain.  Genitourinary:  Negative for dysuria.  Musculoskeletal:  Negative for arthralgias.  Neurological:  Negative for syncope, light-headedness and headaches.  Psychiatric/Behavioral:  Negative for dysphoric mood.    Objective:  BP 110/64   Pulse 74   Temp (!) 97.4 F (36.3 C) (Temporal)   Ht 6' 0.25" (1.835 m)   Wt 235 lb (106.6 kg)   SpO2 97%   BMI 31.65 kg/m   Wt Readings from Last 3 Encounters:  08/23/22 235 lb (106.6 kg)  07/25/22 238 lb 4 oz (108.1 kg)  02/17/22 234 lb 2 oz (106.2 kg)      Physical Exam Constitutional:      General: He is not in acute distress.    Appearance: Normal appearance. He is well-developed. He is not ill-appearing or toxic-appearing.  HENT:  Head: Normocephalic and atraumatic.     Right Ear: Hearing, tympanic membrane, ear canal and external ear normal. No tenderness. No foreign body. Tympanic membrane is not retracted or bulging.     Left Ear: Hearing, tympanic membrane, ear canal and external ear normal. No tenderness. No foreign body. Tympanic membrane is not retracted or bulging.     Nose: No mucosal edema or rhinorrhea.     Right Turbinates: Swollen and pale.     Left Turbinates: Swollen and pale.     Right Sinus: Maxillary sinus tenderness and frontal sinus tenderness present.     Left Sinus: Maxillary sinus tenderness and frontal sinus tenderness present.     Mouth/Throat:     Dentition: Normal dentition. No dental caries.     Pharynx: Uvula midline. No oropharyngeal exudate.     Tonsils: No tonsillar  abscesses.  Eyes:     General: Lids are normal. Lids are everted, no foreign bodies appreciated.     Conjunctiva/sclera: Conjunctivae normal.     Pupils: Pupils are equal, round, and reactive to light.  Neck:     Thyroid: No thyroid mass or thyromegaly.     Vascular: No carotid bruit.     Trachea: Trachea and phonation normal.  Cardiovascular:     Rate and Rhythm: Normal rate and regular rhythm.     Pulses: Normal pulses.     Heart sounds: Normal heart sounds, S1 normal and S2 normal. No murmur heard.    No gallop.  Pulmonary:     Effort: Pulmonary effort is normal. No respiratory distress.     Breath sounds: Normal breath sounds. No wheezing, rhonchi or rales.  Abdominal:     General: Bowel sounds are normal.     Palpations: Abdomen is soft.     Tenderness: There is no abdominal tenderness. There is no guarding or rebound.     Hernia: No hernia is present.  Musculoskeletal:     Cervical back: Normal range of motion and neck supple.  Skin:    General: Skin is warm and dry.     Findings: No rash.  Neurological:     Mental Status: He is alert.     Deep Tendon Reflexes: Reflexes are normal and symmetric.  Psychiatric:        Speech: Speech normal.        Behavior: Behavior normal.        Judgment: Judgment normal.       Results for orders placed or performed in visit on 07/25/22  Basic metabolic panel  Result Value Ref Range   Sodium 137 135 - 145 mEq/L   Potassium 4.2 3.5 - 5.1 mEq/L   Chloride 103 96 - 112 mEq/L   CO2 25 19 - 32 mEq/L   Glucose, Bld 91 70 - 99 mg/dL   BUN 19 6 - 23 mg/dL   Creatinine, Ser 6.25 0.40 - 1.50 mg/dL   GFR 63.89 >37.34 mL/min   Calcium 9.9 8.4 - 10.5 mg/dL  CBC with Differential/Platelet  Result Value Ref Range   WBC 6.4 4.0 - 10.5 K/uL   RBC 4.24 4.22 - 5.81 Mil/uL   Hemoglobin 13.7 13.0 - 17.0 g/dL   HCT 28.7 68.1 - 15.7 %   MCV 96.0 78.0 - 100.0 fl   MCHC 33.7 30.0 - 36.0 g/dL   RDW 26.2 03.5 - 59.7 %   Platelets 262.0 150.0 -  400.0 K/uL   Neutrophils Relative % 56.3 43.0 - 77.0 %  Lymphocytes Relative 32.5 12.0 - 46.0 %   Monocytes Relative 8.0 3.0 - 12.0 %   Eosinophils Relative 1.9 0.0 - 5.0 %   Basophils Relative 1.3 0.0 - 3.0 %   Neutro Abs 3.6 1.4 - 7.7 K/uL   Lymphs Abs 2.1 0.7 - 4.0 K/uL   Monocytes Absolute 0.5 0.1 - 1.0 K/uL   Eosinophils Absolute 0.1 0.0 - 0.7 K/uL   Basophils Absolute 0.1 0.0 - 0.1 K/uL  Hepatic function panel  Result Value Ref Range   Total Bilirubin 0.5 0.2 - 1.2 mg/dL   Bilirubin, Direct 0.1 0.0 - 0.3 mg/dL   Alkaline Phosphatase 40 39 - 117 U/L   AST 22 0 - 37 U/L   ALT 24 0 - 53 U/L   Total Protein 7.1 6.0 - 8.3 g/dL   Albumin 4.2 3.5 - 5.2 g/dL  Lipid panel  Result Value Ref Range   Cholesterol 199 0 - 200 mg/dL   Triglycerides 161.0166.0 (H) 0.0 - 149.0 mg/dL   HDL 96.0449.90 >54.09>39.00 mg/dL   VLDL 81.133.2 0.0 - 91.440.0 mg/dL   LDL Cholesterol 782116 (H) 0 - 99 mg/dL   Total CHOL/HDL Ratio 4    NonHDL 148.98   PSA, Total with Reflex to PSA, Free  Result Value Ref Range   PSA, Total 0.7 < OR = 4.0 ng/mL  Hemoglobin A1c  Result Value Ref Range   Hgb A1c MFr Bld 4.9 4.6 - 6.5 %    Assessment and Plan  Allergic sinusitis Assessment & Plan:  Can use prescription cough suppressant at night.  Start Xyzal at bedtime  in place of benadryl.  Start flonase 2 sprays per nostril daily.  Not improving in next 3-4 days call for possible prednisone taper.  Call sooner if fever, increasing  one sided face pain.  Go to ER if severe shortness of breath.   Other orders -     guaiFENesin-Codeine; Take 5-10 mLs by mouth at bedtime as needed for cough.  Dispense: 180 mL; Refill: 0    No follow-ups on file.   Kerby NoraAmy Bernadine Melecio, MD

## 2022-08-23 NOTE — Assessment & Plan Note (Signed)
Can use prescription cough suppressant at night.  Start Xyzal at bedtime  in place of benadryl.  Start flonase 2 sprays per nostril daily.  Not improving in next 3-4 days call for possible prednisone taper.  Call sooner if fever, increasing  one sided face pain.  Go to ER if severe shortness of breath.

## 2022-08-23 NOTE — Patient Instructions (Addendum)
Can use prescription cough suppressant at night.  Start Xyzal at bedtime  in place of benadryl.  Start flonase 2 sprays per nostril daily.  Not improving in next 3-4 days call for possible prednisone taper.  Call sooner if fever, increasing  one sided face pain.  Go to ER if severe shortness of breath. 

## 2023-01-08 DIAGNOSIS — M25462 Effusion, left knee: Secondary | ICD-10-CM | POA: Diagnosis not present

## 2023-02-07 DIAGNOSIS — M25461 Effusion, right knee: Secondary | ICD-10-CM | POA: Diagnosis not present

## 2023-02-07 DIAGNOSIS — M1711 Unilateral primary osteoarthritis, right knee: Secondary | ICD-10-CM | POA: Diagnosis not present

## 2023-02-07 DIAGNOSIS — G8929 Other chronic pain: Secondary | ICD-10-CM | POA: Diagnosis not present

## 2023-03-17 ENCOUNTER — Other Ambulatory Visit: Payer: Self-pay | Admitting: Family Medicine

## 2023-07-13 ENCOUNTER — Telehealth: Payer: Self-pay | Admitting: *Deleted

## 2023-07-13 DIAGNOSIS — Z13220 Encounter for screening for lipoid disorders: Secondary | ICD-10-CM

## 2023-07-13 DIAGNOSIS — R5383 Other fatigue: Secondary | ICD-10-CM

## 2023-07-13 DIAGNOSIS — Z125 Encounter for screening for malignant neoplasm of prostate: Secondary | ICD-10-CM

## 2023-07-13 DIAGNOSIS — Z131 Encounter for screening for diabetes mellitus: Secondary | ICD-10-CM

## 2023-07-13 NOTE — Telephone Encounter (Signed)
-----   Message from Vincenza Hews sent at 07/13/2023  2:53 PM EST ----- Regarding: Lab Friday 07/14/23 Hello,  Patient is coming in for CPE labs on Friday 07/14/23. Can we get orders please.   Thanks

## 2023-07-14 ENCOUNTER — Other Ambulatory Visit (INDEPENDENT_AMBULATORY_CARE_PROVIDER_SITE_OTHER): Payer: Federal, State, Local not specified - PPO

## 2023-07-14 DIAGNOSIS — Z13220 Encounter for screening for lipoid disorders: Secondary | ICD-10-CM | POA: Diagnosis not present

## 2023-07-14 DIAGNOSIS — Z131 Encounter for screening for diabetes mellitus: Secondary | ICD-10-CM | POA: Diagnosis not present

## 2023-07-14 DIAGNOSIS — Z125 Encounter for screening for malignant neoplasm of prostate: Secondary | ICD-10-CM

## 2023-07-14 DIAGNOSIS — R5383 Other fatigue: Secondary | ICD-10-CM | POA: Diagnosis not present

## 2023-07-14 LAB — LIPID PANEL
Cholesterol: 183 mg/dL (ref 0–200)
HDL: 50.9 mg/dL (ref >39.00)
LDL Cholesterol: 121 mg/dL — ABNORMAL HIGH (ref 0–99)
NonHDL: 132.15
Total CHOL/HDL Ratio: 4
Triglycerides: 56 mg/dL (ref 0.0–149.0)
VLDL: 11.2 mg/dL (ref 0.0–40.0)

## 2023-07-14 LAB — BASIC METABOLIC PANEL
BUN: 14 mg/dL (ref 6–23)
CO2: 28 meq/L (ref 19–32)
Calcium: 8.9 mg/dL (ref 8.4–10.5)
Chloride: 106 meq/L (ref 96–112)
Creatinine, Ser: 0.98 mg/dL (ref 0.40–1.50)
GFR: 83.7 mL/min (ref >60.00)
Glucose, Bld: 93 mg/dL (ref 70–99)
Potassium: 4.1 meq/L (ref 3.5–5.1)
Sodium: 140 meq/L (ref 135–145)

## 2023-07-14 LAB — CBC WITH DIFFERENTIAL/PLATELET
Basophils Absolute: 0 10*3/uL (ref 0.0–0.1)
Basophils Relative: 0.8 % (ref 0.0–3.0)
Eosinophils Absolute: 0.1 10*3/uL (ref 0.0–0.7)
Eosinophils Relative: 2.1 % (ref 0.0–5.0)
HCT: 40.6 % (ref 39.0–52.0)
Hemoglobin: 13.2 g/dL (ref 13.0–17.0)
Lymphocytes Relative: 29.6 % (ref 12.0–46.0)
Lymphs Abs: 1.5 10*3/uL (ref 0.7–4.0)
MCHC: 32.6 g/dL (ref 30.0–36.0)
MCV: 99.6 fl (ref 78.0–100.0)
Monocytes Absolute: 0.5 10*3/uL (ref 0.1–1.0)
Monocytes Relative: 10.1 % (ref 3.0–12.0)
Neutro Abs: 2.9 10*3/uL (ref 1.4–7.7)
Neutrophils Relative %: 57.4 % (ref 43.0–77.0)
Platelets: 256 10*3/uL (ref 150.0–400.0)
RBC: 4.07 Mil/uL — ABNORMAL LOW (ref 4.22–5.81)
RDW: 13.1 % (ref 11.5–15.5)
WBC: 5 10*3/uL (ref 4.0–10.5)

## 2023-07-14 LAB — HEPATIC FUNCTION PANEL
ALT: 16 U/L (ref 0–53)
AST: 17 U/L (ref 0–37)
Albumin: 4 g/dL (ref 3.5–5.2)
Alkaline Phosphatase: 38 U/L — ABNORMAL LOW (ref 39–117)
Bilirubin, Direct: 0.2 mg/dL (ref 0.0–0.3)
Total Bilirubin: 0.8 mg/dL (ref 0.2–1.2)
Total Protein: 6.7 g/dL (ref 6.0–8.3)

## 2023-07-14 LAB — HEMOGLOBIN A1C: Hgb A1c MFr Bld: 4.7 % (ref 4.6–6.5)

## 2023-07-17 LAB — PSA, TOTAL WITH REFLEX TO PSA, FREE: PSA, Total: 1 ng/mL (ref <=4.0)

## 2023-07-18 NOTE — Progress Notes (Unsigned)
 Craige Patel T. Kemon Devincenzi, MD, CAQ Sports Medicine [facility] at [facility] [address] [place], [address]  Phone: [phone]  FAX: [phone]  Darius Solis - 63 y.o. male  MRN [id number]  Date of Birth: 02-06-1961  Date: 07/19/2023  PCP: Hannah Beat, MD  Referral: Hannah Beat, MD  No chief complaint on file.  Patient Care Team: Hannah Beat, MD as PCP - General Subjective:   Draper Gallon is a 63 y.o. pleasant patient who presents with the following:  Preventative Health Maintenance Visit:  Health Maintenance Summary Reviewed and updated, unless pt declines services.  Tobacco History Reviewed. Alcohol: No concerns, no excessive use Exercise Habits: Some activity, rec at least 30 mins 5 times a week STD concerns: no risk or activity to increase risk Drug Use: None  Flu Covid   Health Maintenance  Topic Date Due   INFLUENZA VACCINE  12/15/2022   COVID-19 Vaccine (3 - 2024-25 season) 01/15/2023   Colonoscopy  03/07/2024   DTaP/Tdap/Td (3 - Tdap) 04/23/2030   Hepatitis C Screening  Completed   HIV Screening  Completed   Zoster Vaccines- Shingrix  Completed   HPV VACCINES  Aged Out   Immunization History  Administered Date(s) Administered   Influenza Split 03/02/2011   Influenza,inj,Quad PF,6+ Mos 04/24/2013, 04/27/2016, 01/24/2017   PFIZER(Purple Top)SARS-COV-2 Vaccination 07/25/2019, 08/22/2019   Td 07/09/2009, 04/23/2020   Zoster Recombinant(Shingrix) 02/17/2022, 07/25/2022   Patient Active Problem List   Diagnosis Date Noted   Allergic sinusitis 08/23/2022   ANORGASMIA, 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  No family history on file.  Social History   Social History Narrative   Not on file    Past Medical History, Surgical History, Social History, Family History, Problem List, Medications, and Allergies have been reviewed and  updated if relevant.  Review of Systems: Pertinent positives are listed above.  Otherwise, a full 14 point review of systems has been done in full and it is negative except where it is noted positive.  Objective:   There were no vitals taken for this visit. Ideal Body Weight:    Ideal Body Weight:   No results found.    07/25/2022    3:58 PM 12/20/2017    2:30 PM  Depression screen PHQ 2/9  Decreased Interest 0 0  Down, Depressed, Hopeless 0 0  PHQ - 2 Score 0 0     GEN: well developed, well nourished, no acute distress Eyes: conjunctiva and lids normal, PERRLA, EOMI ENT: TM clear, nares clear, oral exam WNL Neck: supple, no lymphadenopathy, no thyromegaly, no JVD Pulm: clear to auscultation and percussion, respiratory effort normal CV: regular rate and rhythm, S1-S2, no murmur, rub or gallop, no bruits, peripheral pulses normal and symmetric, no cyanosis, clubbing, edema or varicosities GI: soft, non-tender; no hepatosplenomegaly, masses; active bowel sounds all quadrants GU: deferred Lymph: no cervical, axillary or inguinal adenopathy MSK: gait normal, muscle tone and strength WNL, no joint swelling, effusions, discoloration, crepitus  SKIN: clear, good turgor, color WNL, no rashes, lesions, or ulcerations Neuro: normal mental status, normal strength, sensation, and motion Psych: alert; oriented to person, place and time, normally interactive and not anxious or depressed in appearance.  All labs reviewed with patient. Results for orders placed or performed in visit on 07/14/23  PSA, Total with Reflex to PSA, Free   Collection Time: 07/14/23  9:01 AM  Result Value Ref Range   PSA, Total 1.0 < OR = 4.0 ng/mL  Basic metabolic panel   Collection Time: 07/14/23  9:01 AM  Result Value Ref Range   Sodium 140 135 - 145 mEq/L   Potassium 4.1 3.5 - 5.1 mEq/L   Chloride 106 96 - 112 mEq/L   CO2 28 19 - 32 mEq/L   Glucose, Bld 93 70 - 99 mg/dL   BUN 14 6 - 23 mg/dL   Creatinine,  Ser 1.61 0.40 - 1.50 mg/dL   GFR 09.60 >45.40 mL/min   Calcium 8.9 8.4 - 10.5 mg/dL  Hepatic Function Panel   Collection Time: 07/14/23  9:01 AM  Result Value Ref Range   Total Bilirubin 0.8 0.2 - 1.2 mg/dL   Bilirubin, Direct 0.2 0.0 - 0.3 mg/dL   Alkaline Phosphatase 38 (L) 39 - 117 U/L   AST 17 0 - 37 U/L   ALT 16 0 - 53 U/L   Total Protein 6.7 6.0 - 8.3 g/dL   Albumin 4.0 3.5 - 5.2 g/dL  CBC with Differential/Platelet   Collection Time: 07/14/23  9:01 AM  Result Value Ref Range   WBC 5.0 4.0 - 10.5 K/uL   RBC 4.07 (L) 4.22 - 5.81 Mil/uL   Hemoglobin 13.2 13.0 - 17.0 g/dL   HCT 98.1 19.1 - 47.8 %   MCV 99.6 78.0 - 100.0 fl   MCHC 32.6 30.0 - 36.0 g/dL   RDW 29.5 62.1 - 30.8 %   Platelets 256.0 150.0 - 400.0 K/uL   Neutrophils Relative % 57.4 43.0 - 77.0 %   Lymphocytes Relative 29.6 12.0 - 46.0 %   Monocytes Relative 10.1 3.0 - 12.0 %   Eosinophils Relative 2.1 0.0 - 5.0 %   Basophils Relative 0.8 0.0 - 3.0 %   Neutro Abs 2.9 1.4 - 7.7 K/uL   Lymphs Abs 1.5 0.7 - 4.0 K/uL   Monocytes Absolute 0.5 0.1 - 1.0 K/uL   Eosinophils Absolute 0.1 0.0 - 0.7 K/uL   Basophils Absolute 0.0 0.0 - 0.1 K/uL  Lipid panel   Collection Time: 07/14/23  9:01 AM  Result Value Ref Range   Cholesterol 183 0 - 200 mg/dL   Triglycerides 65.7 0.0 - 149.0 mg/dL   HDL 84.69 >62.95 mg/dL   VLDL 28.4 0.0 - 13.2 mg/dL   LDL Cholesterol 440 (H) 0 - 99 mg/dL   Total CHOL/HDL Ratio 4    NonHDL 132.15   Hemoglobin A1c   Collection Time: 07/14/23  9:01 AM  Result Value Ref Range   Hgb A1c MFr Bld 4.7 4.6 - 6.5 %    Assessment and Plan:     ICD-10-CM   1. Healthcare maintenance  Z00.00       Health Maintenance Exam: The patient's preventative maintenance and recommended screening tests for an annual wellness exam were reviewed in full today. Brought up to date unless services declined.  Counselled on the importance of diet, exercise, and its role in overall health and mortality. The  patient's FH and SH was reviewed, including their home life, tobacco status, and drug and alcohol status.  Follow-up in 1 year for physical exam or additional follow-up below.  Disposition: No follow-ups on file.  No orders of the defined types were placed in this encounter.  There are no discontinued medications. No orders of the defined types were placed in this encounter.   Signed,  Elpidio Galea. Teairra Millar, MD   Allergies as of 07/19/2023  No Known Allergies      Medication List        Accurate as of July 18, 2023  2:36 PM. If you have any questions, ask your nurse or doctor.          guaiFENesin-codeine 100-10 MG/5ML syrup Commonly known as: ROBITUSSIN AC Take 5-10 mLs by mouth at bedtime as needed for cough.   sildenafil 20 MG tablet Commonly known as: REVATIO TAKE 2-5 TABLETS BY MOUTH 30 MINUTES PRIOR TO INTERCOURSE AS DIRECTED

## 2023-07-19 ENCOUNTER — Encounter: Payer: Self-pay | Admitting: Family Medicine

## 2023-07-19 ENCOUNTER — Ambulatory Visit (INDEPENDENT_AMBULATORY_CARE_PROVIDER_SITE_OTHER): Payer: Federal, State, Local not specified - PPO | Admitting: Family Medicine

## 2023-07-19 VITALS — BP 100/70 | HR 65 | Temp 97.9°F | Ht 71.75 in | Wt 221.2 lb

## 2023-07-19 DIAGNOSIS — Z0000 Encounter for general adult medical examination without abnormal findings: Secondary | ICD-10-CM | POA: Diagnosis not present

## 2023-07-19 MED ORDER — SILDENAFIL CITRATE 20 MG PO TABS: ORAL_TABLET | ORAL | 11 refills | Status: AC

## 2024-05-22 NOTE — Progress Notes (Signed)
 "    Peter Keyworth T. Gavriel Holzhauer, MD, CAQ Sports Medicine [facility] at [facility] [address] [place], [address]  Phone: [phone]  FAX: [phone]  Darius Solis - 64 y.o. male  MRN [id number]  Date of Birth: 02-17-1961  Date: 05/23/2024  PCP: Watt Mirza, MD  Referral: Watt Mirza, MD  Chief Complaint  Patient presents with   Sinus Drainage    Symptoms started over the weekend Wife was sick last week-He has been using her cough syrup Promethazine  DM   Cough   Fatigue        Subjective:   Olson Dogan is a 64 y.o. very pleasant male patient with Body mass index is 29.81 kg/m. who presents with the following:  Discussed the use of AI scribe software for clinical note transcription with the patient, who gave verbal consent to proceed.  Patient presents with sore throat and nasal congestion.   History of Present Illness Demaree Lunde is a 64 year old male who presents with symptoms of an upper respiratory infection.  He has been experiencing symptoms consistent with an upper respiratory infection for approximately one week. He continues to work despite these symptoms and uses promethazine  DM at night to manage the cough, which helps without causing significant sedation. During the day, he uses over-the-counter medications such as DayQuil and Tylenol  Cold to manage his symptoms.  No significant nausea, vomiting, diarrhea, polyarthralgia.  His flu and COVID tests were negative. He notes that his wife was also sick recently.  He works at a games [employer] , where he has been working ten-hour shifts. He plans to rest on his day off to aid recovery.    Review of Systems is noted in the HPI, as appropriate  Objective:   BP 112/80   Pulse 74   Temp (!) 97.3 F (36.3 C) (Temporal)   Ht 5' 11.75 (1.822 m)   Wt 218 lb 4 oz (99 kg)   SpO2 99%   BMI 29.81 kg/m    Gen: WDWN, NAD. Globally Non-toxic HEENT: Throat clear, w/o  exudate, R TM clear, L TM - good landmarks, No fluid present. rhinnorhea.  MMM Frontal sinuses: NT Max sinuses: NT NECK: Anterior cervical  LAD is absent CV: RRR, No M/G/R, cap refill <2 sec PULM: Breathing comfortably in no respiratory distress. no wheezing, crackles, rhonchi   Laboratory and Imaging Data:  Results for orders placed or performed in visit on 05/23/24  POC COVID-19   Collection Time: 05/23/24 11:48 AM  Result Value Ref Range   SARS Coronavirus 2 Ag Negative Negative  POC Influenza A&B (Binax test)   Collection Time: 05/23/24 11:48 AM  Result Value Ref Range   Influenza A, POC Negative Negative   Influenza B, POC Negative Negative     Assessment and Plan:     ICD-10-CM   1. Viral URI  J06.9     2. Nasal congestion  R09.81 POC COVID-19    POC Influenza A&B (Binax test)    3. Sore throat  J02.9      Assessment & Plan Acute upper respiratory infection Likely viral etiology with negative flu and COVID tests. - Continue promethazine  DM at night for cough. - Use DayQuil during the day for symptom relief. - Ensure rest and hydration. - Receive flu vaccination post-symptom resolution.  Medication Management during today's office visit: Meds ordered this encounter  Medications   promethazine -dextromethorphan (PROMETHAZINE -DM) 6.25-15 MG/5ML syrup    Sig: Take 2.5 mLs by mouth  at bedtime as needed for cough.    Dispense:  118 mL    Refill:  0   There are no discontinued medications.  Orders placed today for conditions managed today: Orders Placed This Encounter  Procedures   POC COVID-19   POC Influenza A&B (Binax test)    Disposition: As needed  [employer] One speech-to-text software was used for transcription in this dictation.  Possible transcriptional errors can occur using Animal nutritionist.   Signed,  Jacques DASEN. Tisha Cline, MD   Outpatient Encounter Medications as of 05/23/2024  Medication Sig   promethazine -dextromethorphan  (PROMETHAZINE -DM) 6.25-15 MG/5ML syrup Take 2.5 mLs by mouth at bedtime as needed for cough.   sildenafil  (REVATIO ) 20 MG tablet TAKE 2-5 TABLETS BY MOUTH 30 MINUTES PRIOR TO INTERCOURSE AS DIRECTED   No facility-administered encounter medications on file as of 05/23/2024.   "

## 2024-05-23 ENCOUNTER — Encounter: Payer: Self-pay | Admitting: Family Medicine

## 2024-05-23 ENCOUNTER — Ambulatory Visit: Admitting: Family Medicine

## 2024-05-23 VITALS — BP 112/80 | HR 74 | Temp 97.3°F | Ht 71.75 in | Wt 218.2 lb

## 2024-05-23 DIAGNOSIS — R0981 Nasal congestion: Secondary | ICD-10-CM | POA: Diagnosis not present

## 2024-05-23 DIAGNOSIS — J069 Acute upper respiratory infection, unspecified: Secondary | ICD-10-CM | POA: Diagnosis not present

## 2024-05-23 DIAGNOSIS — J029 Acute pharyngitis, unspecified: Secondary | ICD-10-CM | POA: Diagnosis not present

## 2024-05-23 LAB — POC INFLUENZA A&B (BINAX/QUICKVUE)
Influenza A, POC: NEGATIVE
Influenza B, POC: NEGATIVE

## 2024-05-23 LAB — POC COVID19 BINAXNOW: SARS Coronavirus 2 Ag: NEGATIVE

## 2024-05-23 MED ORDER — PROMETHAZINE-DM 6.25-15 MG/5ML PO SYRP: 2.5000 mL | ORAL_SOLUTION | Freq: Every evening | ORAL | 0 refills | Status: AC | PRN
